# Patient Record
Sex: Female | Born: 1950 | Race: White | Hispanic: No | Smoking: Current every day smoker
Health system: Southern US, Community
[De-identification: ages and names within clinical notes are randomized; demographics above are authoritative.]

## PROBLEM LIST (undated history)

## (undated) DIAGNOSIS — R569 Unspecified convulsions: Secondary | ICD-10-CM

## (undated) DIAGNOSIS — J45909 Unspecified asthma, uncomplicated: Secondary | ICD-10-CM

## (undated) DIAGNOSIS — M170 Bilateral primary osteoarthritis of knee: Secondary | ICD-10-CM

## (undated) DIAGNOSIS — Z8489 Family history of other specified conditions: Secondary | ICD-10-CM

## (undated) DIAGNOSIS — K5140 Inflammatory polyps of colon without complications: Secondary | ICD-10-CM

## (undated) DIAGNOSIS — K08109 Complete loss of teeth, unspecified cause, unspecified class: Secondary | ICD-10-CM

## (undated) DIAGNOSIS — E70319 Ocular albinism, unspecified: Secondary | ICD-10-CM

## (undated) DIAGNOSIS — F319 Bipolar disorder, unspecified: Secondary | ICD-10-CM

## (undated) DIAGNOSIS — Z972 Presence of dental prosthetic device (complete) (partial): Secondary | ICD-10-CM

## (undated) DIAGNOSIS — D649 Anemia, unspecified: Secondary | ICD-10-CM

## (undated) DIAGNOSIS — F13231 Sedative, hypnotic or anxiolytic dependence with withdrawal delirium: Secondary | ICD-10-CM

## (undated) DIAGNOSIS — F419 Anxiety disorder, unspecified: Secondary | ICD-10-CM

## (undated) DIAGNOSIS — K219 Gastro-esophageal reflux disease without esophagitis: Secondary | ICD-10-CM

## (undated) DIAGNOSIS — R519 Headache, unspecified: Secondary | ICD-10-CM

## (undated) DIAGNOSIS — F32A Depression, unspecified: Secondary | ICD-10-CM

## (undated) DIAGNOSIS — F329 Major depressive disorder, single episode, unspecified: Secondary | ICD-10-CM

## (undated) DIAGNOSIS — J189 Pneumonia, unspecified organism: Secondary | ICD-10-CM

## (undated) DIAGNOSIS — E039 Hypothyroidism, unspecified: Secondary | ICD-10-CM

## (undated) DIAGNOSIS — Z8719 Personal history of other diseases of the digestive system: Secondary | ICD-10-CM

## (undated) DIAGNOSIS — R51 Headache: Secondary | ICD-10-CM

## (undated) DIAGNOSIS — I6523 Occlusion and stenosis of bilateral carotid arteries: Secondary | ICD-10-CM

## (undated) HISTORY — DX: Inflammatory polyps of colon without complications: K51.40

## (undated) HISTORY — DX: Unspecified asthma, uncomplicated: J45.909

## (undated) HISTORY — DX: Major depressive disorder, single episode, unspecified: F32.9

## (undated) HISTORY — PX: CATARACT EXTRACTION W/ INTRAOCULAR LENS  IMPLANT, BILATERAL: SHX1307

## (undated) HISTORY — PX: MULTIPLE TOOTH EXTRACTIONS: SHX2053

## (undated) HISTORY — PX: ROTATOR CUFF REPAIR: SHX139

## (undated) HISTORY — DX: Depression, unspecified: F32.A

## (undated) HISTORY — PX: TUBAL LIGATION: SHX77

## (undated) HISTORY — DX: Bilateral primary osteoarthritis of knee: M17.0

## (undated) HISTORY — DX: Gastro-esophageal reflux disease without esophagitis: K21.9

---

## 2002-04-30 ENCOUNTER — Emergency Department (HOSPITAL_COMMUNITY): Admission: EM | Admit: 2002-04-30 | Discharge: 2002-04-30 | Payer: Self-pay | Admitting: Emergency Medicine

## 2002-09-15 ENCOUNTER — Other Ambulatory Visit: Admission: RE | Admit: 2002-09-15 | Discharge: 2002-09-15 | Payer: Self-pay | Admitting: Obstetrics and Gynecology

## 2003-01-09 HISTORY — PX: VAGINAL HYSTERECTOMY: SUR661

## 2003-01-11 ENCOUNTER — Encounter (INDEPENDENT_AMBULATORY_CARE_PROVIDER_SITE_OTHER): Payer: Self-pay

## 2003-01-11 ENCOUNTER — Observation Stay (HOSPITAL_COMMUNITY): Admission: RE | Admit: 2003-01-11 | Discharge: 2003-01-12 | Payer: Self-pay | Admitting: Obstetrics and Gynecology

## 2003-07-26 ENCOUNTER — Ambulatory Visit (HOSPITAL_COMMUNITY): Admission: RE | Admit: 2003-07-26 | Discharge: 2003-07-26 | Payer: Self-pay | Admitting: Gastroenterology

## 2003-10-10 ENCOUNTER — Other Ambulatory Visit: Admission: RE | Admit: 2003-10-10 | Discharge: 2003-10-10 | Payer: Self-pay | Admitting: Obstetrics and Gynecology

## 2004-05-20 ENCOUNTER — Ambulatory Visit (HOSPITAL_COMMUNITY): Payer: Self-pay | Admitting: Professional Counselor

## 2004-06-09 ENCOUNTER — Ambulatory Visit (HOSPITAL_COMMUNITY): Payer: Self-pay | Admitting: Psychiatry

## 2004-06-11 ENCOUNTER — Ambulatory Visit (HOSPITAL_COMMUNITY): Payer: Self-pay | Admitting: Professional Counselor

## 2004-07-07 ENCOUNTER — Ambulatory Visit (HOSPITAL_COMMUNITY): Payer: Self-pay | Admitting: Psychiatry

## 2004-08-25 ENCOUNTER — Ambulatory Visit (HOSPITAL_COMMUNITY): Payer: Self-pay | Admitting: Psychiatry

## 2004-09-12 ENCOUNTER — Emergency Department (HOSPITAL_COMMUNITY): Admission: EM | Admit: 2004-09-12 | Discharge: 2004-09-12 | Payer: Self-pay | Admitting: Emergency Medicine

## 2004-10-17 ENCOUNTER — Ambulatory Visit: Payer: Self-pay | Admitting: Internal Medicine

## 2004-10-20 ENCOUNTER — Ambulatory Visit (HOSPITAL_COMMUNITY): Admission: RE | Admit: 2004-10-20 | Discharge: 2004-10-20 | Payer: Self-pay | Admitting: Internal Medicine

## 2004-11-04 ENCOUNTER — Ambulatory Visit: Payer: Self-pay | Admitting: Internal Medicine

## 2004-11-12 ENCOUNTER — Ambulatory Visit (HOSPITAL_COMMUNITY): Payer: Self-pay | Admitting: Psychiatry

## 2004-12-10 ENCOUNTER — Ambulatory Visit (HOSPITAL_COMMUNITY): Payer: Self-pay | Admitting: Physician Assistant

## 2005-01-07 ENCOUNTER — Ambulatory Visit: Payer: Self-pay | Admitting: Psychiatry

## 2005-01-07 ENCOUNTER — Ambulatory Visit (HOSPITAL_COMMUNITY): Payer: Self-pay | Admitting: Psychiatry

## 2005-02-17 ENCOUNTER — Ambulatory Visit: Payer: Self-pay | Admitting: *Deleted

## 2005-03-02 ENCOUNTER — Ambulatory Visit (HOSPITAL_COMMUNITY): Payer: Self-pay | Admitting: Psychiatry

## 2005-04-02 ENCOUNTER — Ambulatory Visit: Payer: Self-pay | Admitting: Internal Medicine

## 2005-04-24 ENCOUNTER — Ambulatory Visit: Payer: Self-pay | Admitting: Internal Medicine

## 2005-05-04 ENCOUNTER — Ambulatory Visit (HOSPITAL_COMMUNITY): Payer: Self-pay | Admitting: Psychiatry

## 2005-07-06 ENCOUNTER — Ambulatory Visit (HOSPITAL_COMMUNITY): Payer: Self-pay | Admitting: Psychiatry

## 2005-09-14 ENCOUNTER — Ambulatory Visit (HOSPITAL_COMMUNITY): Payer: Self-pay | Admitting: Psychiatry

## 2005-10-12 ENCOUNTER — Ambulatory Visit (HOSPITAL_COMMUNITY): Payer: Self-pay | Admitting: Physician Assistant

## 2005-11-26 ENCOUNTER — Ambulatory Visit (HOSPITAL_COMMUNITY): Payer: Self-pay | Admitting: Psychiatry

## 2005-12-22 ENCOUNTER — Ambulatory Visit (HOSPITAL_COMMUNITY): Payer: Self-pay | Admitting: Psychiatry

## 2006-06-01 ENCOUNTER — Ambulatory Visit: Payer: Self-pay | Admitting: Internal Medicine

## 2006-06-08 ENCOUNTER — Ambulatory Visit: Payer: Self-pay | Admitting: Internal Medicine

## 2006-06-08 LAB — CONVERTED CEMR LAB
AST: 26 units/L (ref 0–37)
Albumin: 3.7 g/dL (ref 3.5–5.2)
Alkaline Phosphatase: 129 units/L — ABNORMAL HIGH (ref 39–117)
BUN: 11 mg/dL (ref 6–23)
Creatinine, Ser: 0.9 mg/dL (ref 0.4–1.2)
GFR calc non Af Amer: 69 mL/min
Glomerular Filtration Rate, Af Am: 84 mL/min/{1.73_m2}
Sodium: 137 meq/L (ref 135–145)
Total Bilirubin: 0.6 mg/dL (ref 0.3–1.2)
Total Protein: 6.9 g/dL (ref 6.0–8.3)

## 2006-06-21 ENCOUNTER — Ambulatory Visit: Payer: Self-pay

## 2006-07-06 ENCOUNTER — Ambulatory Visit: Payer: Self-pay

## 2006-07-28 ENCOUNTER — Ambulatory Visit: Payer: Self-pay | Admitting: Internal Medicine

## 2006-08-05 ENCOUNTER — Ambulatory Visit (HOSPITAL_COMMUNITY): Payer: Self-pay | Admitting: Psychiatry

## 2006-08-23 ENCOUNTER — Ambulatory Visit: Payer: Self-pay | Admitting: Internal Medicine

## 2006-08-31 ENCOUNTER — Ambulatory Visit (HOSPITAL_COMMUNITY): Payer: Self-pay | Admitting: Psychiatry

## 2006-10-28 ENCOUNTER — Ambulatory Visit (HOSPITAL_COMMUNITY): Payer: Self-pay | Admitting: Psychiatry

## 2007-01-27 ENCOUNTER — Ambulatory Visit (HOSPITAL_COMMUNITY): Payer: Self-pay | Admitting: Psychiatry

## 2007-02-18 ENCOUNTER — Ambulatory Visit (HOSPITAL_COMMUNITY): Payer: Self-pay | Admitting: Psychiatry

## 2007-03-16 ENCOUNTER — Ambulatory Visit (HOSPITAL_COMMUNITY): Payer: Self-pay | Admitting: Psychiatry

## 2007-05-17 ENCOUNTER — Ambulatory Visit (HOSPITAL_COMMUNITY): Payer: Self-pay | Admitting: Psychiatry

## 2007-09-15 ENCOUNTER — Ambulatory Visit (HOSPITAL_COMMUNITY): Payer: Self-pay | Admitting: Psychiatry

## 2007-12-08 ENCOUNTER — Ambulatory Visit (HOSPITAL_COMMUNITY): Payer: Self-pay | Admitting: Psychiatry

## 2008-01-01 ENCOUNTER — Emergency Department (HOSPITAL_COMMUNITY): Admission: EM | Admit: 2008-01-01 | Discharge: 2008-01-01 | Payer: Self-pay | Admitting: Emergency Medicine

## 2008-01-30 ENCOUNTER — Ambulatory Visit (HOSPITAL_COMMUNITY): Payer: Self-pay | Admitting: Psychiatry

## 2008-02-27 ENCOUNTER — Ambulatory Visit (HOSPITAL_COMMUNITY): Payer: Self-pay | Admitting: Psychiatry

## 2008-08-08 ENCOUNTER — Ambulatory Visit (HOSPITAL_COMMUNITY): Payer: Self-pay | Admitting: Psychiatry

## 2008-12-03 ENCOUNTER — Emergency Department (HOSPITAL_COMMUNITY): Admission: EM | Admit: 2008-12-03 | Discharge: 2008-12-03 | Payer: Self-pay | Admitting: Emergency Medicine

## 2008-12-12 ENCOUNTER — Ambulatory Visit (HOSPITAL_COMMUNITY): Payer: Self-pay | Admitting: Psychiatry

## 2010-12-26 NOTE — Op Note (Signed)
NAME:  [name]                        ACCOUNT NO.:  [id number]   MEDICAL RECORD NO.:  [id number]                   PATIENT TYPE:  AMB   LOCATION:  ENDO                                 FACILITY:  [facility]   PHYSICIAN:  James L. Malon Kindle., M.D.          DATE OF BIRTH:  06-Apr-1951   DATE OF PROCEDURE:  07/26/2003  DATE OF DISCHARGE:                                 OPERATIVE REPORT   PROCEDURE:  Esophagogastroduodenoscopy.   MEDICATIONS GIVEN:  Cetacaine spray, Fentanyl 50 mcg, Versed 7.5 mg IV.   INDICATIONS FOR PROCEDURE:  Esophageal reflux, brother with adenocarcinoma  of the esophagus.  The patient has had increasing symptoms despite the use  of Prevacid.   DESCRIPTION OF PROCEDURE:  The procedure had been explained to the patient  and consent obtained.  With the patient in the left lateral decubitus  position, the Olympus scope was inserted and advanced.  The stomach was  entered, the pylorus identified and passed.  The duodenum, duodenal bulb,  and second portion were seen.  The scope was withdrawn back into the  stomach.  The pyloric channel was normal.  The antrum and body were normal.  The fundus and cardia were seen on the retroflex view and were normal.  The  distal and proximal esophagus were seen well and were endoscopically normal.  The GE junction was widely patent with signs of reflux such as reddened  esophagus.  The scope was withdrawn.  The patient tolerated the procedure  well.   ASSESSMENT:  Gastroesophageal reflux, 530.81, no signs of Barrett's or  esophageal cancer.   PLAN:  Will continue Prevacid, give antireflux instructions, proceed  with  colonoscopy at this time.                                               James L. Malon Kindle., M.D.    Waldron Session  D:  07/26/2003  T:  07/26/2003  Job:  [id number]   cc:   Tresa Endo NP Ian Bushman

## 2010-12-26 NOTE — Discharge Summary (Signed)
   NAME:  Devra Dopp                        ACCOUNT NO.:  [id number]   MEDICAL RECORD NO.:  [id number]                   PATIENT TYPE:  OBV   LOCATION:  9303                                 [facility]   PHYSICIAN:  Juluis Mire, M.D.                DATE OF BIRTH:  June 26, 1951   DATE OF ADMISSION:  01/11/2003  DATE OF DISCHARGE:  01/12/2003                                 DISCHARGE SUMMARY   ADMITTING DIAGNOSES:  1. Severe cervical dysplasia.  2. Abnormal uterine bleeding.   DISCHARGE DIAGNOSES:  1. Severe cervical dysplasia.  2. Abnormal uterine bleeding.  3. Pathology pending.   OPERATIVE PROCEDURE:  Laparoscopy-assisted vaginal hysterectomy with  bilateral salpingo-oophorectomy.   For complete History and Physical see dictated note.   COURSE IN THE HOSPITAL:  The patient underwent the above-noted surgery  without complications.  On postoperative day #1 was afebrile with stable  vital signs.  Abdomen was soft and nontender.  All incisions were intact.  She has no active vaginal bleeding.  Her hemoglobin at that time was 11.2.  She was discharged home that day.   COMPLICATIONS:  None encountered during her stay in the hospital.   CONDITION:  The patient discharged home in stable condition.   DISPOSITION:  1. Routine postoperative instructions were given.  2. She was to avoid heavy lifting, vaginal entrance, or driving of a car.  3. Discharged home on Demerol if she needed it for pain.  4. She will follow up in the office in one week.  5. She is to call with fever, nausea/vomiting, increasing abdominal pain, or     active vaginal bleeding.                                               Juluis Mire, M.D.    JSM/MEDQ  D:  01/12/2003  T:  01/12/2003  Job:  [id number]

## 2010-12-26 NOTE — Op Note (Signed)
NAME:  [name]                        ACCOUNT NO.:  [id number]   MEDICAL RECORD NO.:  [id number]                   PATIENT TYPE:  OBV   LOCATION:  9303                                 [facility]   PHYSICIAN:  Juluis Mire, M.D.                DATE OF BIRTH:  05/21/1951   DATE OF PROCEDURE:  01/11/2003  DATE OF DISCHARGE:                                 OPERATIVE REPORT   PREOPERATIVE DIAGNOSES:  1. Abnormal uterine bleeding.  2. Severe cervical dysplasia.   POSTOPERATIVE DIAGNOSES:  1. Abnormal uterine bleeding.  2. Severe cervical dysplasia.  3. Pathology pending.   PROCEDURE:  Laparoscopically-assisted vaginal hysterectomy with bilateral  salpingo-oophorectomy.   SURGEON:  Juluis Mire, M.D.   ASSISTANT:  Stann Mainland. Vincente Poli, M.D.   ANESTHESIA:  General endotracheal.   ESTIMATED BLOOD LOSS:  300 mL.   PACKS AND DRAINS:  None.   INTRAOPERATIVE BLOOD REPLACED:  None.   COMPLICATIONS:  None.   INDICATIONS:  Dictated in the history and physical.   PROCEDURE:  The patient was taken to the OR and placed in supine position.  After a satisfactory level of general endotracheal anesthesia obtained, the  patient was placed in the dorsal lithotomy position using the Allen  stirrups.  The abdomen, perineum, and vagina were prepped out with Betadine.  The bladder was emptied by in-and-out catheterization.  A Hulka tenaculum  was put in place and secured.  The patient was draped out as a sterile  field.  A subumbilical incision made with a knife.  The incision was  extended through the subcutaneous tissue.  The fascia was identified,  entered sharply, and the incision in the fascia extended laterally.  Rectus  muscles were separated in the midline.  The perineum was then entered.  Two  lateral sutures of 0 Vicryl were placed in the fascial edge and secured.  The open laparoscopic trocar was put in place and secured.  The abdomen was  inflated with carbon  dioxide.  The laparoscope was introduced.  There was no  evidence of injury to adjacent organs.  A 5 mm trocar was put in place under  direct visualization in the suprapubic area.  The uterus was posterior,  upper limits of normal size.  Tubes and ovaries were unremarkable.  There  was no evidence of any pelvic pathology, specifically no endometriosis or  adhesions.  The appendix was retrocecal and unremarkable.  The upper  abdomen, including the liver and tip of the gallbladder, was clear.  Using  the Gyrus bipolar cutting unit, we decided to take down the adnexa.  We  first went to the left adnexa.  We elevated the ovary.  The ureter was  easily visualized.  We first cauterized and incised the left ovarian  vasculature.  Then we cauterized and incised the peritoneal attachment of  the ovary and tube up to the round ligament.  Then the round ligament was  cauterized and incised.  We then went to the right side, identified the  ureter.  We elevated the ovary on the right side.  We cauterized and incised  the ovarian vasculature.  We cauterized and incised the peritoneal  attachments of the ovary and tube up to the round ligament.  Then the round  ligament was cauterized and incised.  We had good hemostasis bilaterally and  freeing up of the adnexa.  At this point in time the abdomen was deflated of  its carbon dioxide, the laparoscope was removed.   The patient's legs were repositioned, the Hulka tenaculum then removed.  A  weighted speculum was then placed in the vaginal vault.  The cervix was  grasped with a Christella Hartigan tenaculum.  The cul-de-sac was entered sharply.  Both  uterosacral ligaments were clamped, cut, and suture ligated with 0 Vicryl.  The reflection of the vaginal mucosa anteriorly was incised and the bladder  was dissected superiorly.  Paracervical tissue was clamped, cut, and suture  ligated with 0 Vicryl.  The vesicouterine space was entered and a retractor  was put in place  to retract the bladder superiorly.  Using the clamp, cut,  and tie technique with suture ligature of 0 Vicryl, parametrium was serially  separated from the sides of the uterus.  The uterus was then flipped.  The  remaining pedicles were clamped and cut and the uterus passed off the  operative field.  The pedicles were secured with free ties of 0 Vicryl.  There was a small amount of bleeding encountered on the right side of the  vaginal cuff from an arterial bleeder.  It was identified and clamped and  suture ligated with 0 Vicryl.  With this we had good hemostasis.  A  uterosacral plication stitch of 0 Vicryl was put in place and secured.  The  vaginal mucosa was then reapproximated in a vertical fashion with  interrupted figure-of-eights of 0 Vicryl.  A Foley was placed to straight  drain with retrieval of an adequate amount of clear urine.  A sponge on a  sponge stick was placed in the vaginal vault.  Legs were repositioned.   The abdomen was reinflated with carbon dioxide, the laparoscope was  reintroduced.  We visualized the vaginal cuff and both ovarian vasculature.  There was good hemostasis with no active bleeding at this point in time.  We  thoroughly irrigated the pelvis.  Hemostasis was excellent.  The abdomen  deflated of carbon dioxide, all trocars removed.  The subumbilical fascia  closed with interrupted figure-of-eights of 0 Vicryl.  Skin was closed with  interrupted subcuticulars of 4-0 Vicryl.  The suprapubic incision was closed  with Steri-Strips.  The sponge on a sponge stick was removed from the  vaginal vault.  The patient taken out of the dorsal lithotomy position, once  alert and extubated transferred to the recovery room in good condition.  Sponge, instrument, and needle count reported as correct by the circulating  nurse.  The patient did tolerate the procedure well and was returned to the recovery room in good condition.                                                Juluis Mire, M.D.    JSM/MEDQ  D:  01/11/2003  T:  01/12/2003  Job:  [id number]

## 2010-12-26 NOTE — Op Note (Signed)
NAME:  [name]                        ACCOUNT NO.:  [id number]   MEDICAL RECORD NO.:  [id number]                   PATIENT TYPE:  AMB   LOCATION:  ENDO                                 FACILITY:  [facility]   PHYSICIAN:  Brittany L. Malon Kindle., M.D.          DATE OF BIRTH:  03/18/1951   DATE OF PROCEDURE:  07/26/2003  DATE OF DISCHARGE:                                 OPERATIVE REPORT   PROCEDURE PERFORMED:  Colonoscopy.   ENDOSCOPIST:  Llana Aliment. Randa Evens, M.D.   MEDICATIONS:  The patient received a total of 120 mcg fentanyl and 12 mg  Versed IV.   INSTRUMENT USED:  Pediatric Olympus adjustable colonoscope.   INDICATIONS FOR PROCEDURE:  Strong family history of colon cancer.  Brother  had colon cancer.   DESCRIPTION OF PROCEDURE:  The procedure had been explained to the patient  and consent obtained.  With the patient in the left lateral decubitus  position, the Olympus colonoscope was inserted and advanced.  We were able  to advance to the cecum.  The ileocecal valve and appendiceal orifice were  seen.  The scope was withdrawn and the cecum, ascending colon, transverse  colon, descending and sigmoid colon were seen well upon removal.  No polyps  or other lesions were seen.  The scope was withdrawn.  The patient tolerated  the procedure well.   ASSESSMENT:  Strong family history of colon cancer with negative  colonoscopy, code V16.0.   PLAN:  Will recommend yearly Hemoccults and repeat colonoscopy in five  years.                                               Brittany L. Malon Kindle., M.D.    [name]  D:  07/26/2003  T:  07/26/2003  Job:  [id number]   cc:   Tinnie Gens Med

## 2010-12-26 NOTE — H&P (Signed)
NAME:  [name]                        ACCOUNT NO.:  [id number]   MEDICAL RECORD NO.:  [id number]                   PATIENT TYPE:  OBV   LOCATION:  9399                                 [facility]   PHYSICIAN:  Juluis Mire, M.D.                DATE OF BIRTH:  1950/10/29   DATE OF ADMISSION:  01/11/2003  DATE OF DISCHARGE:                                HISTORY & PHYSICAL   HISTORY:  The patient is a 60 year old gravida 2, para 2 married white  female, who presents for laparoscopically assisted vaginal hysterectomy with  bilateral salpingo-oophorectomy.   In relation to the present admission, the patient has had elevated FSH  levels consistent with menopausal status.  She has had continued abnormal  post-menopausal bleeding.  She underwent a saline infused ultrasound and  endometrial sampling.  There was no evidence of polyps or thickness.  Endometrial sampling was basically negative.  It did reveal some simple  hyperplasia but no atypia.  She has had continued bleeding issues.  Bleeding  has been off and on and at time excessive.  This was becoming increasingly a  problem for the patient.  Associated with this was some discomfort.  She is  sexually active with no discomfort, however.  In addition to this, the  patient has had a previous LEEP of the cervix, with the finding of severe  cervical dysplasia with endocervical involvement.  In view of this and  continued abnormal bleeding, the patient decided to proceed with definitive  therapy in the form of laparoscopically assisted vaginal hysterectomy with  bilateral salpingo-oophorectomy.   ALLERGIES:  CODEINE.   MEDICATIONS:  Synthroid, Klonopin, Paxil.   PAST MEDICAL HISTORY:  The usual childhood diseases.  Does have a history of  hypothyroidism, under active management.   SOCIAL HISTORY:  She has had a previous bilateral tubal ligation.   OBSTETRICAL HISTORY:  Two vaginal deliveries.   FAMILY HISTORY:  Mother  with a history of pancreatic cancer.  Father with  history of heart disease.   SOCIAL HISTORY:  One pack/day tobacco use.  No alcohol use.   REVIEW OF SYSTEMS:  Noncontributory.   PHYSICAL EXAMINATION:  VITAL SIGNS:  The patient is afebrile with stable  vital signs.  HEENT:  The patient is normocephalic.  Pupils are round, reactive to light  and accommodation.  Extraocular movements were intact.  Sclerae and  conjunctivae are clear.  Oropharynx clear.  NECK:  Without thyromegaly.  BREASTS:  Not examined.  LUNGS:  Clear.  CARDIAC:  Regular rate and rhythm; without murmurs or gallops.  ABDOMEN:  Benign.  No mass, organomegaly or tenderness.  PELVIC:  Normal external genitalia.  Vaginal mucosa clear.  Cervix is  unremarkable.  Uterus normal size, shape and contour.  Adnexa are free of  masses or tenderness.  Rectovaginal examination is clear.  EXTREMITIES:  Trace edema.  NEUROLOGIC:  Grossly normal.  IMPRESSION:  1. Severe cervical dysplasia, with endocervical involvement.  2. Post-menopausal bleeding, unresponsive to conservative management.   PLAN:  The patient will undergo laparoscopically assisted vaginal  hysterectomy with bilateral salpingo-oophorectomy .  The risks of surgery  have been discussed, including the risks of infection;  risks of hemorrhage  that could necessitate transfusion with the risk of AIDS or Hepatitis; the  risks of injury to adjacent organs, including bladder, bowel or ureters that  could require further exploratory surgery; the risks of deep venous  thrombosis and pulmonary embolus.  She also understands that there can be  recurrent dysplastic changes of the vaginal cuff, requiring further  evaluation and management.                                               Juluis Mire, M.D.    JSM/MEDQ  D:  01/11/2003  T:  01/11/2003  Job:  [id number]

## 2010-12-26 NOTE — Assessment & Plan Note (Signed)
[facility]                          [place] OFFICE NOTE   NAME:KNIGHTSatcha, Storlie                     MRN:          [id number]  DATE:06/01/2006                            DOB:          05/07/1951    CHIEF COMPLAINT:  Spells.   HISTORY OF PRESENT ILLNESS:  Mrs. Abboud is a 60 year old white female with  a history of bipolar, who came to the office with a 2 month history of  episodes characterized by weakness, clamminess, shakiness.  The patient also  gets dizzy.  She feels like she is about to pass out.  She actually has  never lost consciousness.  These episodes last 10 minutes, and she has 1 to  3 spells a week.  The symptoms are not exertional and are rather associated  with emotional distress.  [facility].  EKG was  done, she also had some blood work, and she was told to see a primary care  doctor as soon as possible.   PAST MEDICAL HISTORY:  1. Bipolar diagnosed in 2005.  2. Hypothyroidism.  3. Hysterectomy and BSO in 2004.  4. Shoulder surgery.  5. Dr. Randa Evens performed an EGD and a colonoscopy in 2004.   FAMILY HISTORY:  1. Father is deceased from a massive MI at age 55, and she also has      another distant relative with coronary artery disease.  2. Some family members are affected with diabetes.  3. Brother had colon cancer.  4. No history of breast cancer.   SOCIAL HISTORY:  The patient smokes a pack a day.  Does not drink.  She is  married and has 2 children.  She is presently without a job.   REVIEW OF SYSTEMS:  She denies any fever, lower extremity edema, cough.  She  admits to 1 episode of chest pain related with the above-described spells.  This was described as mild, mostly at the left lower chest, without nausea,  but with some diaphoresis.  No headache. She admits to being under a lot of  stress initially because she was training for a job, but right now she is  jobless, and that  causes her some stress as well.   MEDICATIONS:  1. Synthroid 112 mcg a day.  2. Prozac 40 one p.o. q. day.  3. Klonopin 1 mg 1 p.o. b.i.d.  4. Prilosec.  5. Seroquel 100 mg 1 p.o. nightly.   ALLERGIES:  CODEINE.   PHYSICAL EXAM:  The patient is alert, oriented.  She is slightly anxious.  Weight 162, pulse 100, respirations 16, blood pressure 120/80.  NECK:  No JVD.  LUNGS:  Basically clear without any respiratory distress.  CARDIOVASCULAR:  Regular rate and rhythm without a murmur.  EXTREMITIES:  No edema.  ABDOMEN:  Not distended.  Soft.  Good bowel sounds.  No organomegaly.  NEUROLOGIC:  Speech, gait, motor are intact.   LABORATORY AND X-RAYS:  EKG at [facility] shows sinus rhythm.  EKG repeated  today is sinus rhythm without any acute changes.   ASSESSMENT AND PLAN:  1. The patient has episodes, as described above.  The most-likely      diagnosis is anxiety.  However, given her multiple risk factors,      including age and tobacco abuse, we will have to rule out underlying      cardiovascular disease.  She also has a family history of heart disease      to some degree since her father had a heart attack, even though it was      late in his life.  At this point, I am going to wait and see what blood      work was done at [facility], and depending on that, I will request, at      the very minimum, a CBC, and a comprehensive panel.  After that, we      would proceed with a stress test.  She is advised to go to the      emergency room if she gets worse.  In the case of an acute attack, she      also can try Atarax 25 mg 1 p.o. q.i.d.  I prescribed her 30 without      refills.  She is aware of the drowsiness potential.  I tried to      prescribe Xanax with her.  However, she reports that at some point in      her life, she did abuse it, so I will stay away from it.  2. Bipolar disorder.  I recommend the patient to see her psychiatrist for      a dose adjustment.  3. History of  hyperthyroidism.  Will need to check a TSH if that was not      done at [facility].       Willow Ora, MD      JP/MedQ  DD:  06/01/2006  DT:  06/02/2006  Job #:  [id number]

## 2011-09-10 ENCOUNTER — Other Ambulatory Visit: Payer: Self-pay | Admitting: Gastroenterology

## 2011-09-10 DIAGNOSIS — R1013 Epigastric pain: Secondary | ICD-10-CM

## 2011-09-15 ENCOUNTER — Other Ambulatory Visit: Payer: Self-pay

## 2011-09-23 ENCOUNTER — Ambulatory Visit: Payer: Self-pay | Admitting: Internal Medicine

## 2011-09-23 ENCOUNTER — Ambulatory Visit
Admission: RE | Admit: 2011-09-23 | Discharge: 2011-09-23 | Disposition: A | Discharge status: Home / Self Care | Payer: 59 | Source: Ambulatory Visit | Attending: Gastroenterology | Admitting: Gastroenterology

## 2011-09-23 DIAGNOSIS — R1013 Epigastric pain: Secondary | ICD-10-CM

## 2011-09-23 MED ORDER — IOHEXOL 300 MG/ML  SOLN
100.0000 mL | Freq: Once | INTRAMUSCULAR | Status: AC | PRN
  Administered 2011-09-23: 100 mL via INTRAVENOUS

## 2011-10-21 ENCOUNTER — Ambulatory Visit: Payer: Self-pay | Admitting: Internal Medicine

## 2011-10-30 ENCOUNTER — Other Ambulatory Visit: Payer: Self-pay | Admitting: Gastroenterology

## 2012-01-01 ENCOUNTER — Other Ambulatory Visit: Payer: Self-pay | Admitting: Gastroenterology

## 2012-01-13 ENCOUNTER — Encounter: Payer: Self-pay | Admitting: Family Medicine

## 2012-01-13 ENCOUNTER — Ambulatory Visit (INDEPENDENT_AMBULATORY_CARE_PROVIDER_SITE_OTHER): Payer: 59 | Admitting: Family Medicine

## 2012-01-13 ENCOUNTER — Ambulatory Visit: Payer: 59 | Admitting: Family Medicine

## 2012-01-13 VITALS — BP 112/78 | HR 106 | Temp 98.3°F | Ht 65.0 in | Wt 156.8 lb

## 2012-01-13 DIAGNOSIS — E039 Hypothyroidism, unspecified: Secondary | ICD-10-CM

## 2012-01-13 DIAGNOSIS — M170 Bilateral primary osteoarthritis of knee: Secondary | ICD-10-CM

## 2012-01-13 DIAGNOSIS — F319 Bipolar disorder, unspecified: Secondary | ICD-10-CM | POA: Insufficient documentation

## 2012-01-13 DIAGNOSIS — M1710 Unilateral primary osteoarthritis, unspecified knee: Secondary | ICD-10-CM

## 2012-01-13 DIAGNOSIS — F17200 Nicotine dependence, unspecified, uncomplicated: Secondary | ICD-10-CM

## 2012-01-13 DIAGNOSIS — Z720 Tobacco use: Secondary | ICD-10-CM | POA: Insufficient documentation

## 2012-01-13 DIAGNOSIS — F3132 Bipolar disorder, current episode depressed, moderate: Secondary | ICD-10-CM

## 2012-01-13 MED ORDER — ACETAMINOPHEN-CODEINE 300-60 MG PO TABS: 1.0000 | ORAL_TABLET | ORAL | Status: DC | PRN

## 2012-01-13 NOTE — Progress Notes (Signed)
  Subjective:    Patient ID: [name], female    DOB: 1951-05-07, 61 y.o.   MRN: [id number]  HPI New to establish.  Previous MD- Jeannetta Nap and prior to that, [facility].  Pt reports she was having confrontations w/ PA at previous office over filling meds too early.  Has not had recent CPE, no recent mammo (2003)  Osteoarthritis of knees- bilateral.  Pt taking 6 T4 tabs daily.  Has not seen ortho for this, not in pain management.  Saw Dr Madelon Lips for shoulder surgery.  On disability for knee pain/bipolar.  Bipolar- chronic problem, on disability for this.  Currently on Effexor and Seroquel.  Has never been on mood stabilizer.  Not seeing psych.  Previous PCP was managing.  Dora Sims at [facility] was managing before she switched to Dr Jeannetta Nap.  Does not feel sxs are well controlled on current meds.  Has had 4 day episode of 'just crying and i don't know why'.  Will also have episodes of hypomania.  Hypothyroid- dx'd in 1986.  Previously seeing Dr Dagoberto Ligas.  Has had recent lab work.  On Synthroid .  Tobacco use- chronic problem, smoking 1 ppd.  'thinking about it' in regards to quitting.  Family hx of pancreas/colon/esophageal cancer- pt has appt upcoming at [facility] due to flat polyp seen on colonoscopy done by Dr Evette Cristal.     Review of Systems For ROS see HPI     Objective:   Physical Exam  Vitals reviewed. Constitutional: She is oriented to person, place, and time. She appears well-developed and well-nourished. No distress.  HENT:  Head: Normocephalic and atraumatic.  Eyes: Conjunctivae and EOM are normal. Pupils are equal, round, and reactive to light.  Neck: Normal range of motion. Neck supple. No thyromegaly present.  Cardiovascular: Normal rate, regular rhythm, normal heart sounds and intact distal pulses.   No murmur heard. Pulmonary/Chest: Effort normal and breath sounds normal. No respiratory distress.  Abdominal: Soft. She exhibits no distension. There is no tenderness.    Musculoskeletal: She exhibits no edema.       Degenerative changes of bilateral knees along joint lines  Lymphadenopathy:    She has no cervical adenopathy.  Neurological: She is alert and oriented to person, place, and time.  Skin: Skin is warm and dry.  Psychiatric: She has a normal mood and affect. Her behavior is normal.          Assessment & Plan:

## 2012-01-13 NOTE — Patient Instructions (Signed)
Schedule your complete physical at your convenience We'll call you with your ortho appt- if you do not go to this appt, I will not be able to continue to prescribe your pain medication (I don't treat chronic pain) Please call and schedule an appt with a psychiatrist to better manage your bipolar Call with any questions or concerns Hang in there!!

## 2012-01-18 ENCOUNTER — Other Ambulatory Visit: Payer: Self-pay | Admitting: Family Medicine

## 2012-01-18 NOTE — Telephone Encounter (Signed)
Patient called & LM on triage line for a refill for  KLONOPIN 0.5 MG Take 0.5 mg by mouth 3 (three) times daily as needed. Please call if you can refill [phone] Patient did not specify pharmacy last pharmacy used on a prescription is   [facility] - [place] - [address]

## 2012-01-18 NOTE — Telephone Encounter (Signed)
Ok for #90, no refills but will then need to obtain from psych

## 2012-01-18 NOTE — Telephone Encounter (Signed)
Last OV noted 01-13-12, pt was NEW to Establish, please advise

## 2012-01-19 ENCOUNTER — Telehealth: Payer: Self-pay | Admitting: *Deleted

## 2012-01-19 ENCOUNTER — Telehealth: Payer: Self-pay | Admitting: Family Medicine

## 2012-01-19 MED ORDER — CLONAZEPAM 0.5 MG PO TABS: 0.5000 mg | ORAL_TABLET | Freq: Three times a day (TID) | ORAL | Status: DC | PRN

## 2012-01-19 NOTE — Telephone Encounter (Signed)
noted 

## 2012-01-19 NOTE — Telephone Encounter (Signed)
Pt left msg on triage vmail returning your call.

## 2012-01-19 NOTE — Telephone Encounter (Signed)
Caller: [name]/Patient; PCP: Sheliah Hatch.; CB#: [phone]; ; ; Call regarding Medication Question; Calling from [facility] [phone].  RPh got on phone and asked about Rx as their power has been out for hours so they cannot get faxed orders.  Asked to verify if the Rx for Clonazepam has been authorized.  Advised of order in EMR 01/19/12.  Voiced understanding and has no further questions.  Call ended and unable to reestablish contact with patient or pharmacy.  Medication Questions - Adult protocol used.

## 2012-01-19 NOTE — Telephone Encounter (Signed)
Noted a vm received from [place] stating she is with [facility] advising the pt has received her RX successfully, will call pharmacy tomorrow to clarify once the power is back on per unable to verify caller Francine Graven truly a pharmacist. MD Beverely Low made aware verbally

## 2012-01-19 NOTE — Telephone Encounter (Signed)
Noted incoming call to CMA Kristie Cowman advising that the pharmacy [facility] is out of power since yesterday and the RX needs to be called into the pharmacy personal cell number [phone], tried to contact [facility] number listed in chart as [phone]  And noted no answer, called [phone] and was routed to VM, left message to contact our office about this medication refill left personal extension to notify, received vm from [place] from number [phone] advising that the pt medication was refilled successfully, will contact pharmacy tomorrow to clarify.
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
## 2012-01-19 NOTE — Telephone Encounter (Signed)
Pt returned call and was advised that a refill will be faxed today however she will need to follow up with psych to get further refills, pt understood and stated she will call the one MD Beverely Low suggested to make her apt

## 2012-01-20 NOTE — Telephone Encounter (Signed)
Done

## 2012-01-21 ENCOUNTER — Other Ambulatory Visit: Payer: Self-pay | Admitting: Family Medicine

## 2012-01-21 MED ORDER — QUETIAPINE FUMARATE 400 MG PO TABS: 400.0000 mg | ORAL_TABLET | Freq: Every day | ORAL | Status: DC

## 2012-01-21 NOTE — Telephone Encounter (Signed)
All medications have been clarified with pharmacy tech nancy at [facility]. Pt aware

## 2012-01-21 NOTE — Telephone Encounter (Signed)
Discuss with patient, Rx sent. 
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
## 2012-01-21 NOTE — Telephone Encounter (Signed)
Pt calling for refill on Seroquel; she prefers [facility].  Please call her to advise.

## 2012-01-21 NOTE — Telephone Encounter (Signed)
Ok for #30 but I do not prescribe this med- needs to see psych as recommended at OV

## 2012-01-31 NOTE — Assessment & Plan Note (Signed)
New to provider.  Ongoing for pt.  Pt is considering quitting.  Encouraged this.  Will follow at future visits.

## 2012-01-31 NOTE — Assessment & Plan Note (Signed)
New to provider, ongoing for pt.  She has never seen ortho for this issue but reports this is why she is, in part, collecting disability.  I am not comfortable writing such large amounts of narcotics for someone who has not seen ortho for discussion on possible tx options.  If there are no other options, pt will need to see pain management.

## 2012-01-31 NOTE — Assessment & Plan Note (Signed)
New to provider, chronic for pt.  She doesn't feel sxs are well controlled on current meds.  Had very frank discussion w/ pt that I do not treat bipolar- particularly if sxs are not stable- and she needs to re-establish w/ psych.  List of providers w/ contact info given.  Pt to call and set up appt.

## 2012-01-31 NOTE — Assessment & Plan Note (Signed)
New to provider.  Chronic for pt.  She reports she recently had labs done.  Will get records and adjust meds prn.

## 2012-02-02 ENCOUNTER — Other Ambulatory Visit: Payer: Self-pay | Admitting: *Deleted

## 2012-02-02 NOTE — Telephone Encounter (Signed)
Spoke with pt & she is concerned that Dr. Beverely Low will not refill her Klonopin & Seroquil before her next appt which is 8.28.13. Pt would like a call back to advise her on what to do.

## 2012-02-02 NOTE — Telephone Encounter (Signed)
Spoke to MD Beverely Low with pt concern per pt has upcoming CPE on 04-06-12 and with MD Tabori and upcoming apt with Ortho on 03-31-12 [place], MD Tabori gave verbal instructions that she will fill these medications for the pt to last until her apt with Ortho however pt needs to keep her ortho apt per MD Beverely Low does not manage chronic pain, pt was advised all instructions and understood, pt will keep all upcoming apts and call office when her refills are due

## 2012-02-04 ENCOUNTER — Telehealth: Payer: Self-pay | Admitting: Family Medicine

## 2012-02-04 NOTE — Telephone Encounter (Signed)
Patient called in at 2pm stated she just spoke to someone here regarding her ortho appt/medication refills. It does appear she was spoken to on 6.25.13 but not today. Patient requested status of the following  Clonazepam & seroquel Advised patient Clonazepam called in 6.11 to elizabeth, seroquel to nancy 6.13.13 Patient still seemed confused, you may want to call her back but she did not request it.

## 2012-02-05 NOTE — Telephone Encounter (Signed)
.  left message to have patient return my call.  
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
## 2012-02-09 ENCOUNTER — Other Ambulatory Visit: Payer: Self-pay | Admitting: *Deleted

## 2012-02-09 NOTE — Telephone Encounter (Signed)
01-13-12 Last OV, Last filled #204

## 2012-02-09 NOTE — Telephone Encounter (Signed)
Has pt seen ortho about her knee pain and the need for this much pain medication?  I will not continue to prescribe this amount of narcotics.  Pt will need to tx pain w/ ortho or pain management.

## 2012-02-10 MED ORDER — ACETAMINOPHEN-CODEINE 300-60 MG PO TABS: 1.0000 | ORAL_TABLET | ORAL | Status: DC | PRN

## 2012-02-10 NOTE — Telephone Encounter (Signed)
Ok for #204, no refills

## 2012-02-10 NOTE — Telephone Encounter (Signed)
.  rx faxed to pharmacy, manually called pt to advise, pt understood faxed to [facility]

## 2012-02-10 NOTE — Telephone Encounter (Signed)
.  left message to have patient return my call, noted upcoming apt for 03-31-12 with ortho

## 2012-02-10 NOTE — Telephone Encounter (Signed)
Pt called back to advise that she will see ortho on 03-31-12 but Dr Beverely Low advise she would filled med until appt.

## 2012-02-15 ENCOUNTER — Other Ambulatory Visit: Payer: Self-pay | Admitting: Family Medicine

## 2012-02-15 MED ORDER — CLONAZEPAM 0.5 MG PO TABS: 0.5000 mg | ORAL_TABLET | Freq: Three times a day (TID) | ORAL | Status: DC | PRN

## 2012-02-15 NOTE — Telephone Encounter (Signed)
Noted last refill on 01-19-12 for #90 no refills, MD Tabori gave verbal order to give pt #12 pills to supply her til she can see her psych tomorrow, pt advised via telephone and clarified her pharmacy as [facility], manually faxed signed RX

## 2012-02-15 NOTE — Telephone Encounter (Signed)
90 CLONAZEPAM 0.5 MG TABLETS  0 REFILLS HAS NO REFILLS LEFT REFILL HISTORY : 01/20/12 90

## 2012-02-15 NOTE — Telephone Encounter (Signed)
Patient called back on status of Clonazepam refill request; states she is "going to see psychiatrist tomorrow"/SLS

## 2012-02-17 NOTE — Telephone Encounter (Signed)
Called pt to advise MD Tabori instructions that note the psychiatrist is now in charge of her Clonazepam management now, pt was not sure whom she saw yesterday at her apt, pt did find a number of the office noted as [phone] with [facility] and noted they did not want her to take the Clonazepam any more per dangerous and to attend their therapy sessions instead but pt feels she needs this medication  advised that I will call the office to see what they advised for their plan of care , pt understood.  Called [facility] office and spoke to [place], whom noted pt saw both the psychiatrist and the therapist yesterday, MD Matilde Bash whom advised that the pt STOP taking Clonazepam and start taking the Seroquel and continue to follow up with him as well as utilize the office therapy and counseling to assist, called pt to advised and note that MD Tabori notes the plan of care implemented from MD [facility] and is in total agreement, pt notes kept repeating "I just dont know what I am going to do now, what if I have a panic attack" advised that she can contact [place] office to speak with MD Eulah Pont assistance to discuss her concerns, pt understood and will contact them.MD Beverely Low made aware verbally

## 2012-02-17 NOTE — Telephone Encounter (Signed)
Patient called again reporting that she did see psychiatrist on 07.09.13 and request "to get the rest of her Clonazepam" Rx. Please advise.

## 2012-02-18 NOTE — Telephone Encounter (Signed)
Pt left VM stating that she would like for Dr Beverely Low or someone in this office to give her a call back in reference to her clonazepam. Called Pt back she states that she needs to get her clonazepam fill because this is not a med that you can just stop. Advise Pt per previous note that Dr Beverely Low is no longer refill this med since she is seeing psych/therapist and per phone call to their office they have advise that she is to no longer take this med and changed to Seroquel. Pt indicated that she knows nothing about this but know that you can't just stop this med and not get something else. Pt instructed to contact therapist/psych to further assist her in medication management of this med. Pt ok Verbalized understanding and states that she will contact them and again states that you can't just stop this med and I need my med because it helps with my panic attack and without it I may have withdrawal or possibly a seizure.

## 2012-02-18 NOTE — Telephone Encounter (Signed)
We have told pt multiple times to speak w/ psych about her concerns.  We will not address this issue w/ her.  She was sent to a specialist and we will adhere to their recommendations

## 2012-02-18 NOTE — Telephone Encounter (Signed)
Faxed back request MD Tabori no longer filling medications and patient is aware, faxed went thru sucessful

## 2012-02-19 NOTE — Telephone Encounter (Signed)
Called pt to update if she has spoken with the pysch office to note her concerns, pt advised that she did not call them because they will just tell her the same thing and she needs her medication and she should not just stop taking it, advised that the psych MD is in charge of her current dx that she has noted to require this medication and he advised she no longer needs it, pt noted MD told her that she needs to urge her PCP to give her this medication, advised that I spoke to the pysch MD's nurse and she noted he no longer wants her on this medication and MD Beverely Low advised that he is the expert and will not overide what he says per again he is now in charge of that aspect of her care, urged pt to call psych MD and advise her concerns for stopping the medication and they will address her concerns pt understood and noted she will contact their office,MD Tabori made aware verbally

## 2012-03-07 ENCOUNTER — Other Ambulatory Visit: Payer: Self-pay | Admitting: Family Medicine

## 2012-03-07 NOTE — Telephone Encounter (Signed)
Refill QUEtiapine Fumarate (Tab) SEROQUEL 400 MG Take 1 tablet (400 mg total) by mouth daily. # 30, last fill 6.17.13, last ov 6.5.13 establish care

## 2012-03-08 NOTE — Telephone Encounter (Signed)
Called pharmacy to advise this medication refill needs to be filled from another MD/specialist, pharmacy rep noted pt did pick up RX yesterday noted by MD Lacy Duverney

## 2012-03-11 ENCOUNTER — Telehealth: Payer: Self-pay | Admitting: Family Medicine

## 2012-03-11 NOTE — Telephone Encounter (Signed)
I don't feel comfortable doing this and it has to be picked up anyway.  It can wait until Monday.Marland Kitchen

## 2012-03-11 NOTE — Telephone Encounter (Signed)
Noted  

## 2012-03-11 NOTE — Telephone Encounter (Signed)
Last OV 01-13-12 to Establish Care, last refill 02-10-12 #204 no refills, please note the following notation:  Neena Rhymes, MD 02/10/2012 1:15 PM Signed  Ok for #204, no refills Candie Echevaria, CMA 02/10/2012 1:09 PM Signed  Pt called back to advise that she will see ortho on 03-31-12 but Dr Beverely Low advise she would filled med until appt.

## 2012-03-11 NOTE — Telephone Encounter (Signed)
Refill: 204 Acetaminophen. Last fill 7.3.13 #204

## 2012-03-16 NOTE — Telephone Encounter (Signed)
FYI: No other requests or call from pt noted at this time to request for the refill

## 2012-03-16 NOTE — Telephone Encounter (Signed)
acetaminophen-codeine (TYLENOL #4) was sent on 02-10-12 for #204 no refills to hold pt until ortho apt 03-31-12

## 2012-03-16 NOTE — Telephone Encounter (Signed)
Ok for #204- this will be last fill b/c she will see Ortho later this month

## 2012-03-16 NOTE — Telephone Encounter (Signed)
Is this a tylenol refill or controlled substance request?

## 2012-03-18 MED ORDER — ACETAMINOPHEN-CODEINE 300-60 MG PO TABS: 1.0000 | ORAL_TABLET | ORAL | Status: DC | PRN

## 2012-03-18 NOTE — Telephone Encounter (Signed)
Called pt to advise that her RX for Acetaminophen-Codeine rx has been placed at front desk for pick up, left vm to advise office hours

## 2012-04-06 ENCOUNTER — Encounter: Payer: 59 | Admitting: Family Medicine

## 2012-07-12 ENCOUNTER — Telehealth: Payer: Self-pay | Admitting: Family Medicine

## 2012-07-12 NOTE — Telephone Encounter (Signed)
Fax received from [facility] regarding multiple prescriptions for Clonazepam for this patient. Per Dr. Beverely Low, patient will no longer receive controlled substances from this office.

## 2013-01-02 ENCOUNTER — Encounter (HOSPITAL_COMMUNITY): Payer: Self-pay | Admitting: *Deleted

## 2013-01-02 ENCOUNTER — Emergency Department (HOSPITAL_COMMUNITY)
Admission: EM | Admit: 2013-01-02 | Discharge: 2013-01-02 | Disposition: A | Discharge status: Home / Self Care | Payer: PRIVATE HEALTH INSURANCE | Attending: Emergency Medicine | Admitting: Emergency Medicine

## 2013-01-02 DIAGNOSIS — E079 Disorder of thyroid, unspecified: Secondary | ICD-10-CM | POA: Insufficient documentation

## 2013-01-02 DIAGNOSIS — F411 Generalized anxiety disorder: Secondary | ICD-10-CM | POA: Insufficient documentation

## 2013-01-02 DIAGNOSIS — Z860100 Personal history of colon polyps, unspecified: Secondary | ICD-10-CM | POA: Insufficient documentation

## 2013-01-02 DIAGNOSIS — K219 Gastro-esophageal reflux disease without esophagitis: Secondary | ICD-10-CM | POA: Insufficient documentation

## 2013-01-02 DIAGNOSIS — R110 Nausea: Secondary | ICD-10-CM | POA: Insufficient documentation

## 2013-01-02 DIAGNOSIS — Z8739 Personal history of other diseases of the musculoskeletal system and connective tissue: Secondary | ICD-10-CM | POA: Insufficient documentation

## 2013-01-02 DIAGNOSIS — Z79899 Other long term (current) drug therapy: Secondary | ICD-10-CM | POA: Insufficient documentation

## 2013-01-02 DIAGNOSIS — F17200 Nicotine dependence, unspecified, uncomplicated: Secondary | ICD-10-CM | POA: Insufficient documentation

## 2013-01-02 DIAGNOSIS — G4700 Insomnia, unspecified: Secondary | ICD-10-CM | POA: Insufficient documentation

## 2013-01-02 DIAGNOSIS — Z8659 Personal history of other mental and behavioral disorders: Secondary | ICD-10-CM | POA: Insufficient documentation

## 2013-01-02 DIAGNOSIS — F419 Anxiety disorder, unspecified: Secondary | ICD-10-CM

## 2013-01-02 DIAGNOSIS — J45909 Unspecified asthma, uncomplicated: Secondary | ICD-10-CM | POA: Insufficient documentation

## 2013-01-02 MED ORDER — CLONAZEPAM 0.5 MG PO TABS: 0.5000 mg | ORAL_TABLET | Freq: Three times a day (TID) | ORAL | Status: DC | PRN

## 2013-01-02 MED ORDER — LORAZEPAM 1 MG PO TABS
1.0000 mg | ORAL_TABLET | Freq: Once | ORAL | Status: AC
  Administered 2013-01-02: 1 mg via ORAL
  Filled 2013-01-02: qty 1

## 2013-01-02 NOTE — ED Notes (Signed)
Pt in lobby, asking visitors and pts for money for "bus pass", even though pt was offered a bus pass. Pt then left after receiving money from visitors. Pts sister called back after pt had already left.   [place], rn talked with visitor that gave pt money and apologized. Pt is now off the premises.

## 2013-01-02 NOTE — ED Provider Notes (Signed)
Medical screening examination/treatment/procedure(s) were performed by non-physician practitioner and as supervising physician I was immediately available for consultation/collaboration.   Rolan Bucco, MD 01/02/13 1452

## 2013-01-02 NOTE — ED Notes (Addendum)
Pt reported to chatman rn that she did not have a ride home, wanted to take the ambulance home. Charge rn discussed with pt that taking ambulance home would be very expensive. Pt reports she has no money, pt offered bus pass, pt refused, she thinks bus does not go to her road. Pt contacting neighbor and sister for ride. Pt will wait in lobby and can use phone to contact ride again if needed.

## 2013-01-02 NOTE — ED Provider Notes (Signed)
History     CSN: [id number]  Arrival date & time 01/02/13  4098   First MD Initiated Contact with Patient 01/02/13 [phone]      Chief Complaint  Patient presents with  . panic attack x2 days     (Consider location/radiation/quality/duration/timing/severity/associated sxs/prior treatment) HPI  Patient is a 62 year old female past medical history significant for depression and anxiety presenting to the emergency department with 2 episodes of panic attacks that have not passed as patient does not have any more on medication. Patient states panic attacks are not precipitated by any event or factor. States these two recent panic attacks are with the same symptoms as previous panic attacks with jitteriness, nausea, and anxious feeling. Patient states she is unable to see her primary care doctor until Thursday. Pt denies CP, SOB. Denies recreational drug or alcohol use, suicidal or homicidal ideations, auditory or visual hallucinations.  Past Medical History  Diagnosis Date  . Asthma   . Depression   . GERD (gastroesophageal reflux disease)   . Thyroid disease   . Inflammatory polyps of colon     pt notes flat and is to have surgery to remove   . Osteoarthritis of both knees     Past Surgical History  Procedure Laterality Date  . Abdominal hysterectomy    . Cataract extraction, bilateral    . Shoulder surgery      Family History  Problem Relation Age of Onset  . Cancer Mother   . Cancer Brother   . Cancer Brother     History  Substance Use Topics  . Smoking status: Current Every Day Smoker -- 1.00 packs/day for 40 years  . Smokeless tobacco: Not on file  . Alcohol Use: No    OB History   Grav Para Term Preterm Abortions TAB SAB Ect Mult Living                  Review of Systems  Constitutional: Negative for fever and chills.  Eyes: Negative for visual disturbance.  Respiratory: Negative for shortness of breath.   Cardiovascular: Negative for chest pain.   Neurological: Negative for headaches.  Psychiatric/Behavioral: Positive for sleep disturbance. Negative for suicidal ideas and self-injury. The patient is nervous/anxious.     Allergies  Cortisone  Home Medications   Current Outpatient Rx  Name  Route  Sig  Dispense  Refill  . acetaminophen-codeine (TYLENOL #4) 300-60 MG per tablet   Oral   Take 1-2 tablets by mouth every 4 (four) hours as needed (not to exceed 6 tabs daily).   204 tablet   0   . buPROPion (WELLBUTRIN SR) 100 MG 12 hr tablet   Oral   Take 100 mg by mouth every morning.         . clonazePAM (KLONOPIN) 0.5 MG tablet   Oral   Take 0.5 mg by mouth 3 (three) times daily as needed for anxiety.          Marland Kitchen ibuprofen (ADVIL,MOTRIN) 200 MG tablet   Oral   Take 800 mg by mouth every 8 (eight) hours as needed for headache.         . levothyroxine (SYNTHROID, LEVOTHROID) 100 MCG tablet   Oral   Take 100 mcg by mouth every morning.          Marland Kitchen QUEtiapine (SEROQUEL) 400 MG tablet   Oral   Take 400 mg by mouth at bedtime.         . clonazePAM (KLONOPIN) 0.5  MG tablet   Oral   Take 1 tablet (0.5 mg total) by mouth 3 (three) times daily as needed for anxiety.   12 tablet   0     BP 127/92  Pulse 114  Temp(Src) 97.9 F (36.6 C) (Oral)  Resp 18  Ht 5' 6.5" (1.689 m)  Wt 150 lb (68.04 kg)  BMI 23.85 kg/m2  SpO2 96%  Physical Exam  Constitutional: She is oriented to person, place, and time. She appears well-developed and well-nourished. No distress.  HENT:  Head: Normocephalic and atraumatic.  Eyes: Conjunctivae are normal.  Neck: Neck supple.  Neurological: She is alert and oriented to person, place, and time.  Skin: Skin is warm and dry. She is not diaphoretic.  Psychiatric: Her speech is normal and behavior is normal. Judgment and thought content normal. Her mood appears anxious. Cognition and memory are normal.    ED Course  Procedures (including critical care time)   Date: 01/02/2013   Rate: 94  Rhythm: normal sinus rhythm  QRS Axis: normal  Intervals: normal  ST/T Wave abnormalities: normal  Conduction Disutrbances:none  Narrative Interpretation:   Old EKG Reviewed: none available    Labs Reviewed - No data to display No results found.   1. Anxiety       MDM  Patient presents to the emergency department complaining of symptoms consistent with anxiety.  Patient has a history of same with similar episodes.  The patient is resting comfortably, in no apparent distress and asymptomatic. ECG and vital signs reviewed.  No exophthalmos.  Stress reducing mechanisms discussed including caffeine intake.  Patient has been referred to psychiatric services for follow-up. Patient d/w with Dr. Fredderick Phenix, agrees with plan. Discharged with a 3 day prescription for normal Klonopin home dosing 0.5mg  TID PRN. Patient is stable at time of discharge            Jeannetta Ellis, PA-C 01/02/13 1330

## 2013-01-02 NOTE — ED Notes (Addendum)
ems reports pt hx of anxiety. Panic attack x2 days. Recently switched from klonopin to xanax. Medication ran out 2 days ago. Has appt with pcp on Thursday, could not wait till then.   Addendum:  Pt having diarrhea for two days, can't sleep and experiencing insomnia since Friday night. Pt states that nothing happened to precipitate the panic attacks except being alone and running out of medication.

## 2013-06-01 ENCOUNTER — Ambulatory Visit (INDEPENDENT_AMBULATORY_CARE_PROVIDER_SITE_OTHER): Payer: PRIVATE HEALTH INSURANCE | Admitting: Family Medicine

## 2013-06-01 VITALS — BP 100/64 | HR 134 | Temp 98.4°F | Resp 20 | Ht 66.5 in | Wt 141.0 lb

## 2013-06-01 DIAGNOSIS — F411 Generalized anxiety disorder: Secondary | ICD-10-CM

## 2013-06-01 MED ORDER — CLONAZEPAM 1 MG PO TABS: 1.0000 mg | ORAL_TABLET | Freq: Three times a day (TID) | ORAL | Status: DC | PRN

## 2013-06-01 NOTE — Patient Instructions (Addendum)
Go to [facility] for evaluation now.  I am worried that you will run out of your klonopin which could be DEADLY as you have been taking so much.  Please contact Dr. Clarene Duke and let him know what is going on and I strongly suggest that you let them admit you for withdrawal rather than just continuing on the medicine that isn't working for you (in normal doses).   Please establish w/ one of the psychiatrists belowl  [facility]  [address], [place], [place] [address]  [phone]  [facility]  Address: [address], [place], [place] [address]  [phone]  [facility] - Dr. Allena Katz Address: [address] [place], [place] [address]  [phone]  [facility] [address] [place], [id number] Phone: [phone]
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
## 2013-10-25 ENCOUNTER — Encounter (HOSPITAL_COMMUNITY): Payer: Self-pay | Admitting: Emergency Medicine

## 2013-10-25 ENCOUNTER — Emergency Department (HOSPITAL_COMMUNITY)
Admission: EM | Admit: 2013-10-25 | Discharge: 2013-10-25 | Disposition: A | Discharge status: Home / Self Care | Payer: PRIVATE HEALTH INSURANCE | Attending: Emergency Medicine | Admitting: Emergency Medicine

## 2013-10-25 DIAGNOSIS — R259 Unspecified abnormal involuntary movements: Secondary | ICD-10-CM | POA: Insufficient documentation

## 2013-10-25 DIAGNOSIS — R197 Diarrhea, unspecified: Secondary | ICD-10-CM | POA: Insufficient documentation

## 2013-10-25 DIAGNOSIS — F3289 Other specified depressive episodes: Secondary | ICD-10-CM | POA: Diagnosis not present

## 2013-10-25 DIAGNOSIS — M1710 Unilateral primary osteoarthritis, unspecified knee: Secondary | ICD-10-CM | POA: Diagnosis not present

## 2013-10-25 DIAGNOSIS — E079 Disorder of thyroid, unspecified: Secondary | ICD-10-CM | POA: Diagnosis not present

## 2013-10-25 DIAGNOSIS — F329 Major depressive disorder, single episode, unspecified: Secondary | ICD-10-CM | POA: Insufficient documentation

## 2013-10-25 DIAGNOSIS — F17200 Nicotine dependence, unspecified, uncomplicated: Secondary | ICD-10-CM | POA: Insufficient documentation

## 2013-10-25 DIAGNOSIS — J45909 Unspecified asthma, uncomplicated: Secondary | ICD-10-CM | POA: Insufficient documentation

## 2013-10-25 DIAGNOSIS — Z8719 Personal history of other diseases of the digestive system: Secondary | ICD-10-CM | POA: Insufficient documentation

## 2013-10-25 DIAGNOSIS — Z760 Encounter for issue of repeat prescription: Secondary | ICD-10-CM | POA: Insufficient documentation

## 2013-10-25 DIAGNOSIS — IMO0002 Reserved for concepts with insufficient information to code with codable children: Secondary | ICD-10-CM | POA: Insufficient documentation

## 2013-10-25 DIAGNOSIS — Z87440 Personal history of urinary (tract) infections: Secondary | ICD-10-CM | POA: Diagnosis not present

## 2013-10-25 DIAGNOSIS — Z79899 Other long term (current) drug therapy: Secondary | ICD-10-CM | POA: Diagnosis not present

## 2013-10-25 DIAGNOSIS — Z860100 Personal history of colon polyps, unspecified: Secondary | ICD-10-CM | POA: Insufficient documentation

## 2013-10-25 DIAGNOSIS — F410 Panic disorder [episodic paroxysmal anxiety] without agoraphobia: Secondary | ICD-10-CM | POA: Diagnosis not present

## 2013-10-25 LAB — RAPID URINE DRUG SCREEN, HOSP PERFORMED
Amphetamines: NOT DETECTED
Barbiturates: NOT DETECTED
Benzodiazepines: NOT DETECTED
COCAINE: NOT DETECTED
OPIATES: POSITIVE — AB
Tetrahydrocannabinol: NOT DETECTED

## 2013-10-25 MED ORDER — CLONAZEPAM 0.5 MG PO TABS
1.0000 mg | ORAL_TABLET | Freq: Every day | ORAL | Status: DC
  Administered 2013-10-25: 1 mg via ORAL
  Filled 2013-10-25: qty 2

## 2013-10-25 MED ORDER — CLONAZEPAM 0.125 MG PO TBDP: 1.0000 mg | ORAL_TABLET | Freq: Two times a day (BID) | ORAL | Status: DC

## 2013-10-25 MED ORDER — CLONAZEPAM 1 MG PO TABS: 1.0000 mg | ORAL_TABLET | Freq: Four times a day (QID) | ORAL | Status: DC | PRN

## 2013-10-25 NOTE — ED Provider Notes (Signed)
CSN: [id number]     Arrival date & time 10/25/13  1142 History   First MD Initiated Contact with Patient 10/25/13 1229     Chief Complaint  Patient presents with  . Medication Refill     (Consider location/radiation/quality/duration/timing/severity/associated sxs/prior Treatment) HPI  [name] Is a 63 year old female with a past medical history of depression,, anxiety, panic attacks.  The patient presents emergency department for medication refill.  She states that she takes 1 mg Klonopin and 4 times daily and has been on it for approximately 10 years.  The patient went to her primary care physician Dr. Aida Puffer [place].  She was denied refill of her medications today because her urine was not positive for the medication.  She states that she has a urine drug screen every time she goes for refill of the medication.  She also states that she had had her urine, back negative before and been denied refill.  Patient states that she has been taking her medication as directed.  She shows me the bottle which contained 120 clonazepam 1 mg dispensed on 09/25/2013.  She states she took her medication yesterday and took her last half tablet this morning.  Patient states that she was diagnosed with a urinary tract infection and is being treated.  Patient denies telling her medications.  She complains of tremor, diarrhea, anxiety.  Past Medical History  Diagnosis Date  . Asthma   . Depression   . GERD (gastroesophageal reflux disease)   . Thyroid disease   . Inflammatory polyps of colon     pt notes flat and is to have surgery to remove   . Osteoarthritis of both knees    Past Surgical History  Procedure Laterality Date  . Abdominal hysterectomy    . Cataract extraction, bilateral    . Shoulder surgery     Family History  Problem Relation Age of Onset  . Cancer Mother   . Cancer Brother   . Cancer Brother    History  Substance Use Topics  . Smoking status:  Current Every Day Smoker -- 1.00 packs/day for 40 years  . Smokeless tobacco: Not on file  . Alcohol Use: No   OB History   Grav Para Term Preterm Abortions TAB SAB Ect Mult Living                 Review of Systems  Ten systems reviewed and are negative for acute change, except as noted in the HPI.    Allergies  Review of patient's allergies indicates no known allergies.  Home Medications   Current Outpatient Rx  Name  Route  Sig  Dispense  Refill  . acetaminophen-codeine (TYLENOL #4) 300-60 MG per tablet   Oral   Take 2 tablets by mouth every 6 (six) hours as needed for moderate pain.         Marland Kitchen albuterol (PROVENTIL HFA;VENTOLIN HFA) 108 (90 BASE) MCG/ACT inhaler   Inhalation   Inhale 2 puffs into the lungs every 4 (four) hours as needed for wheezing or shortness of breath.         Marland Kitchen buPROPion (WELLBUTRIN SR) 200 MG 12 hr tablet   Oral   Take 200 mg by mouth daily.         . clonazePAM (KLONOPIN) 1 MG tablet   Oral   Take 0.5-1 mg by mouth 4 (four) times daily.         Marland Kitchen levothyroxine (SYNTHROID, LEVOTHROID) 100  MCG tablet   Oral   Take 100 mcg by mouth daily before breakfast.         . PARoxetine (PAXIL) 10 MG tablet   Oral   Take 10 mg by mouth daily.         . QUEtiapine (SEROQUEL) 400 MG tablet   Oral   Take 600 mg by mouth at bedtime.          BP 153/101  Pulse 113  Temp(Src) 97.6 F (36.4 C) (Oral)  Resp 18  SpO2 100% Physical Exam Physical Exam  Nursing note and vitals reviewed. Constitutional: She is oriented to person, place, and time. She appears well-developed and well-nourished. No distress.  HENT:  Head: Normocephalic and atraumatic.  Eyes: Conjunctivae normal and EOM are normal. Pupils are equal, round, and reactive to light. No scleral icterus.  Neck: Normal range of motion.  Cardiovascular: Normal rate, regular rhythm and normal heart sounds.  Exam reveals no gallop and no friction rub.   No murmur heard. Pulmonary/Chest:  Effort normal and breath sounds normal. No respiratory distress.  Abdominal: Soft. Bowel sounds are normal. She exhibits no distension and no mass. There is no tenderness. There is no guarding.  Neurological: She is alert and oriented to person, place, and time.  Skin: Skin is warm and dry. She is not diaphoretic.    ED Course  Procedures (including critical care time) Labs Review Labs Reviewed  URINE RAPID DRUG SCREEN (HOSP PERFORMED)   Imaging Review No results found.   EKG Interpretation None      MDM   Final diagnoses:  Medication refill    Patient with sxs of withdraw.  I did recheck her uds which is again benzodiazepine negative.  I however did a literature search and found that most drug screens test for Nordiazepam and Oxazepam which ar the metabolites of Diazepam and Temazepam. The metabolite of Clonazepam is 7-aminoclonazepam and is tested for it's ability to cross react with the two aforementioned benzodiazepines, which can frequently lead to false negatives. These results usually show up once sent for mass spectroscopy. (see article- https://www.dunlap.com/http://www.practicalpainmanagement.com/treatments/pharmacological/demystifying-benzodiazepine-urine-drug-screen-results)   I will therefore refill the patient's medication until next tuesday and provide her with the article.  A review of the  drug database show consistent, regular rx from same provider.  The patient appears reasonably screened and/or stabilized for discharge and I doubt any other medical condition or other [facility] requiring further screening, evaluation, or treatment in the ED at this time prior to discharge.     Arthor CaptainAbigail Avon Mergenthaler, PA-C 10/25/13 1344

## 2013-10-25 NOTE — ED Notes (Signed)
Pt is here for a refill on her clonazepam.

## 2013-10-25 NOTE — ED Provider Notes (Signed)
Medical screening examination/treatment/procedure(s) were performed by non-physician practitioner and as supervising physician I was immediately available for consultation/collaboration.   EKG Interpretation None        William Roma Bierlein, MD 10/25/13 1543 

## 2013-10-25 NOTE — Discharge Instructions (Signed)
Medication Refill, Emergency Department We have refilled your medication today as a courtesy to you. It is best for your medical care, however, to take care of getting refills done through your primary caregiver's office. They have your records and can do a better job of follow-up than we can in the emergency department. On maintenance medications, we often only prescribe enough medications to get you by until you are able to see your regular caregiver. This is a more expensive way to refill medications. In the future, please plan for refills so that you will not have to use the emergency department for this. Thank you for your help. Your help allows [place] to better take care of the daily emergencies that enter our department. Document Released: 11/13/2003 Document Revised: 10/19/2011 Document Reviewed: 07/27/2005 [facility] Patient Information 2014 [place], [place]. Clonazepam tablets What is this medicine? CLONAZEPAM (kloe NA ze pam) is a benzodiazepine. It is used to treat certain types of seizures. It is also used to treat panic disorder. This medicine may be used for other purposes; ask your health care provider or pharmacist if you have questions. COMMON BRAND NAME(S): Ceberclon , Klonopin What should I tell my health care provider before I take this medicine? They need to know if you have any of these conditions: -an alcohol or drug abuse problem -bipolar disorder, depression, psychosis or other mental health condition -glaucoma -kidney or liver disease -lung or breathing disease -myasthenia gravis -Parkinson's disease -seizures or a history of seizures -suicidal thoughts -an unusual or allergic reaction to clonazepam, other benzodiazepines, foods, dyes, or preservatives -pregnant or trying to get pregnant -breast-feeding How should I use this medicine? Take this medicine by mouth with a glass of water. Follow the directions on the prescription label. If it upsets your stomach, take it  with food or milk. Take your medicine at regular intervals. Do not take it more often than directed. Do not stop taking or change the dose except on the advice of your doctor or health care professional. A special MedGuide will be given to you by the pharmacist with each prescription and refill. Be sure to read this information carefully each time. Talk to your pediatrician regarding the use of this medicine in children. Special care may be needed. Overdosage: If you think you have taken too much of this medicine contact a poison control center or emergency room at once. NOTE: This medicine is only for you. Do not share this medicine with others. What if I miss a dose? If you miss a dose, take it as soon as you can. If it is almost time for your next dose, take only that dose. Do not take double or extra doses. What may interact with this medicine? -herbal or dietary supplements -medicines for depression, anxiety, or psychotic disturbances -medicines for fungal infections like fluconazole, itraconazole, ketoconazole, voriconazole -medicines for HIV infection or AIDS -medicines for sleep -prescription pain medicines -propantheline -rifampin -sevelamer -some medicines for seizures like carbamazepine, phenobarbital, phenytoin, primidone This list may not describe all possible interactions. Give your health care provider a list of all the medicines, herbs, non-prescription drugs, or dietary supplements you use. Also tell them if you smoke, drink alcohol, or use illegal drugs. Some items may interact with your medicine. What should I watch for while using this medicine? Visit your doctor or health care professional for regular checks on your progress. Your body may become dependent on this medicine. If you have been taking this medicine regularly for some time, do not  suddenly stop taking it. You must gradually reduce the dose or you may get severe side effects. Ask your doctor or health care  professional for advice before increasing or decreasing the dose. Even after you stop taking this medicine it can still affect your body for several days. If you suffer from several types of seizures, this medicine may increase the chance of grand mal seizures (epilepsy). Let your doctor or health care professional know, he or she may want to prescribe an additional medicine. You may get drowsy or dizzy. Do not drive, use machinery, or do anything that needs mental alertness until you know how this medicine affects you. To reduce the risk of dizzy and fainting spells, do not stand or sit up quickly, especially if you are an older patient. Alcohol may increase dizziness and drowsiness. Avoid alcoholic drinks. Do not treat yourself for coughs, colds or allergies without asking your doctor or health care professional for advice. Some ingredients can increase possible side effects. The use of this medicine may increase the chance of suicidal thoughts or actions. Pay special attention to how you are responding while on this medicine. Any worsening of mood, or thoughts of suicide or dying should be reported to your health care professional right away. Women who become pregnant while using this medicine may enroll in the [place] Antiepileptic Drug Pregnancy Registry by calling [phone]. This registry collects information about the safety of antiepileptic drug use during pregnancy. What side effects may I notice from receiving this medicine? Side effects that you should report to your doctor or health care professional as soon as possible: -allergic reactions like skin rash, itching or hives, swelling of the face, lips, or tongue -changes in vision -confusion -depression -hallucinations -mood changes, excitability or aggressive behavior -movement difficulty, staggering or jerky movements -muscle cramps, weakness -tremors -unusual eye movements Side effects that usually do not require medical  attention (report to your doctor or health care professional if they continue or are bothersome): -constipation or diarrhea -difficulty sleeping, nightmares -dizziness, drowsiness -headache -increased saliva from your mouth -nausea, vomiting This list may not describe all possible side effects. Call your doctor for medical advice about side effects. You may report side effects to FDA at 1-800-FDA-1088. Where should I keep my medicine? Keep out of the reach of children. This medicine can be abused. Keep your medicine in a safe place to protect it from theft. Do not share this medicine with anyone. Selling or giving away this medicine is dangerous and against the law. Store at room temperature between 15 and 30 degrees C (59 and 86 degrees F). Protect from light. Keep container tightly closed. Throw away any unused medicine after the expiration date. NOTE: This sheet is a summary. It may not cover all possible information. If you have questions about this medicine, talk to your doctor, pharmacist, or health care provider.  2014, Elsevier/Gold Standard. (2009-06-21 19:16:36)

## 2013-10-25 NOTE — ED Notes (Signed)
Pt states ran out of clonazepam; doctor would not refill it until next Tuesday; states needs medication

## 2014-01-27 ENCOUNTER — Emergency Department (HOSPITAL_COMMUNITY)
Admission: EM | Admit: 2014-01-27 | Discharge: 2014-01-27 | Disposition: A | Discharge status: Home / Self Care | Payer: PRIVATE HEALTH INSURANCE | Attending: Emergency Medicine | Admitting: Emergency Medicine

## 2014-01-27 ENCOUNTER — Encounter (HOSPITAL_COMMUNITY): Payer: Self-pay | Admitting: Emergency Medicine

## 2014-01-27 DIAGNOSIS — Z8719 Personal history of other diseases of the digestive system: Secondary | ICD-10-CM | POA: Insufficient documentation

## 2014-01-27 DIAGNOSIS — Z760 Encounter for issue of repeat prescription: Secondary | ICD-10-CM | POA: Insufficient documentation

## 2014-01-27 DIAGNOSIS — F411 Generalized anxiety disorder: Secondary | ICD-10-CM | POA: Insufficient documentation

## 2014-01-27 DIAGNOSIS — Z79899 Other long term (current) drug therapy: Secondary | ICD-10-CM | POA: Insufficient documentation

## 2014-01-27 DIAGNOSIS — F17200 Nicotine dependence, unspecified, uncomplicated: Secondary | ICD-10-CM | POA: Insufficient documentation

## 2014-01-27 DIAGNOSIS — Z860100 Personal history of colon polyps, unspecified: Secondary | ICD-10-CM | POA: Insufficient documentation

## 2014-01-27 DIAGNOSIS — M1710 Unilateral primary osteoarthritis, unspecified knee: Secondary | ICD-10-CM | POA: Insufficient documentation

## 2014-01-27 DIAGNOSIS — IMO0002 Reserved for concepts with insufficient information to code with codable children: Secondary | ICD-10-CM | POA: Insufficient documentation

## 2014-01-27 DIAGNOSIS — E079 Disorder of thyroid, unspecified: Secondary | ICD-10-CM | POA: Diagnosis not present

## 2014-01-27 DIAGNOSIS — F329 Major depressive disorder, single episode, unspecified: Secondary | ICD-10-CM | POA: Diagnosis not present

## 2014-01-27 DIAGNOSIS — J45909 Unspecified asthma, uncomplicated: Secondary | ICD-10-CM | POA: Insufficient documentation

## 2014-01-27 DIAGNOSIS — F3289 Other specified depressive episodes: Secondary | ICD-10-CM | POA: Diagnosis not present

## 2014-01-27 DIAGNOSIS — F419 Anxiety disorder, unspecified: Secondary | ICD-10-CM

## 2014-01-27 MED ORDER — CLONAZEPAM 1 MG PO TABS: 1.0000 mg | ORAL_TABLET | Freq: Four times a day (QID) | ORAL | Status: DC

## 2014-01-27 MED ORDER — CLONAZEPAM 0.5 MG PO TABS
1.0000 mg | ORAL_TABLET | Freq: Once | ORAL | Status: AC
  Administered 2014-01-27: 1 mg via ORAL
  Filled 2014-01-27 (×2): qty 2

## 2014-01-27 NOTE — ED Provider Notes (Signed)
CSN: [id number]     Arrival date & time 01/27/14  1135 History  This chart was scribed for non-physician practitioner, Fayrene HelperBowie Tran, PA-C, working with Rolland PorterMark James, MD, by Ardelia Memsylan Malpass ED Scribe. This patient was seen in room WTR6/WTR6 and the patient's care was started at 12:17 PM.   Chief Complaint  Patient presents with  . Anxiety  . Medication Refill    The history is provided by the patient. No language interpreter was used.    HPI Comments: [name] is a 63 y.o. female with a history of anxiety and depression brought by EMS to the Emergency Department requesting a medication refill. She states that she has been having anxiety and panic attacks. She states that she is prescribed Klonopin, but that she has ran out of the medication, and will not be able to get a refill from her PCP until 3 days from now. She states that she is prescribed 1 mg Xanax, 4x daily. She denies any SI, HI or hallucinations. She denies using any illicit drugs or alcohol.   Past Medical History  Diagnosis Date  . Asthma   . Depression   . GERD (gastroesophageal reflux disease)   . Thyroid disease   . Inflammatory polyps of colon     pt notes flat and is to have surgery to remove   . Osteoarthritis of both knees    Past Surgical History  Procedure Laterality Date  . Abdominal hysterectomy    . Cataract extraction, bilateral    . Shoulder surgery     Family History  Problem Relation Age of Onset  . Cancer Mother   . Cancer Brother   . Cancer Brother    History  Substance Use Topics  . Smoking status: Current Every Day Smoker -- 1.00 packs/day for 40 years    Types: Cigarettes  . Smokeless tobacco: Not on file  . Alcohol Use: No   OB History   Grav Para Term Preterm Abortions TAB SAB Ect Mult Living                 Review of Systems  Psychiatric/Behavioral: Negative for suicidal ideas and hallucinations. The patient is nervous/anxious.        Denies HI    Allergies  Review of  patient's allergies indicates no known allergies.  Home Medications   Prior to Admission medications   Medication Sig Start Date End Date Taking? Authorizing Detavious Rinn  acetaminophen-codeine (TYLENOL #4) 300-60 MG per tablet Take 2 tablets by mouth every 6 (six) hours as needed for moderate pain.    Historical Maybelline Kolarik, MD  albuterol (PROVENTIL HFA;VENTOLIN HFA) 108 (90 BASE) MCG/ACT inhaler Inhale 2 puffs into the lungs every 4 (four) hours as needed for wheezing or shortness of breath.    Historical Jalessa Peyser, MD  buPROPion (WELLBUTRIN SR) 200 MG 12 hr tablet Take 200 mg by mouth daily.    Historical Dary Dilauro, MD  clonazePAM (KLONOPIN) 1 MG tablet Take 0.5-1 mg by mouth 4 (four) times daily.    Historical Kayron Kalmar, MD  clonazePAM (KLONOPIN) 1 MG tablet Take 1 tablet (1 mg total) by mouth 4 (four) times daily as needed for anxiety. 10/25/13   Arthor CaptainAbigail Harris, PA-C  levothyroxine (SYNTHROID, LEVOTHROID) 100 MCG tablet Take 100 mcg by mouth daily before breakfast.    Historical Orestes Geiman, MD  PARoxetine (PAXIL) 10 MG tablet Take 10 mg by mouth daily.    Historical Timarion Agcaoili, MD  QUEtiapine (SEROQUEL) 400 MG tablet Take 600 mg by  mouth at bedtime.    Historical Joelyn Lover, MD   Triage Vitals: BP 145/91  Pulse 115  Temp(Src) 98.7 F (37.1 C) (Oral)  Resp 19  SpO2 97%  Physical Exam  Nursing note and vitals reviewed. Constitutional: She is oriented to person, place, and time. She appears well-developed and well-nourished. No distress.  HENT:  Head: Normocephalic and atraumatic.  Eyes: Conjunctivae and EOM are normal.  Neck: Neck supple. No tracheal deviation present.  Cardiovascular: Normal rate.   Pulmonary/Chest: Effort normal. No respiratory distress.  Musculoskeletal: Normal range of motion.  Neurological: She is alert and oriented to person, place, and time.  Skin: Skin is warm and dry.  Psychiatric: She has a normal mood and affect. Her behavior is normal.    ED Course  Procedures  (including critical care time)  DIAGNOSTIC STUDIES: Oxygen Saturation is 97% on RA, normal by my interpretation.    COORDINATION OF CARE: 12:21 PM- Discussed plan to prescribe pt a small amount of Klonopin until her appointment with her PCP on Tuesday. Will also give pt a dose of Klonopin while she is in the ED. Pt advised of plan for treatment and pt agrees.  Labs Review Labs Reviewed - No data to display  Imaging Review No results found.   EKG Interpretation None      MDM   Final diagnoses:  Encounter for medication refill  Anxiety    BP 145/91  Pulse 115  Temp(Src) 98.7 F (37.1 C) (Oral)  Resp 19  SpO2 97%  I personally performed the services described in this documentation, which was scribed in my presence. The recorded information has been reviewed and is accurate.     Fayrene HelperBowie Tran, PA-C 01/27/14 2009

## 2014-01-27 NOTE — ED Notes (Signed)
Pt from home via EMS c/o "panic attack." Pt states that last year her brother died and since she has been under a lot of stress. Pt states that she only has 1/2 of a Klonopin left and she feels that she cannot go until Tuesday w/o her meds. Pt has no other c/o, is A&O and in NAD.

## 2014-01-27 NOTE — Discharge Instructions (Signed)
Medication Refill, Emergency Department We have refilled your medication today as a courtesy to you. It is best for your medical care, however, to take care of getting refills done through your primary caregiver's office. They have your records and can do a better job of follow-up than we can in the emergency department. On maintenance medications, we often only prescribe enough medications to get you by until you are able to see your regular caregiver. This is a more expensive way to refill medications. In the future, please plan for refills so that you will not have to use the emergency department for this. Thank you for your help. Your help allows us to better take care of the daily emergencies that enter our department. Document Released: 11/13/2003 Document Revised: 10/19/2011 Document Reviewed: 07/27/2005 [facility] Patient Information 2015 [place], [place]. This information is not intended to replace advice given to you by your health care provider. Make sure you discuss any questions you have with your health care provider.  Panic Attacks Panic attacks are sudden, short-livedsurges of severe anxiety, fear, or discomfort. They may occur for no reason when you are relaxed, when you are anxious, or when you are sleeping. Panic attacks may occur for a number of reasons:   Healthy people occasionally have panic attacks in extreme, life-threatening situations, such as war or natural disasters. Normal anxiety is a protective mechanism of the body that helps us react to danger (fight or flight response).  Panic attacks are often seen with anxiety disorders, such as panic disorder, social anxiety disorder, generalized anxiety disorder, and phobias. Anxiety disorders cause excessive or uncontrollable anxiety. They may interfere with your relationships or other life activities.  Panic attacks are sometimes seen with other mental illnesses, such as depression and posttraumatic stress disorder.  Certain  medical conditions, prescription medicines, and drugs of abuse can cause panic attacks. SYMPTOMS  Panic attacks start suddenly, peak within 20 minutes, and are accompanied by four or more of the following symptoms:  Pounding heart or fast heart rate (palpitations).  Sweating.  Trembling or shaking.  Shortness of breath or feeling smothered.  Feeling choked.  Chest pain or discomfort.  Nausea or strange feeling in your stomach.  Dizziness, light-headedness, or feeling like you will faint.  Chills or hot flushes.  Numbness or tingling in your lips or hands and feet.  Feeling that things are not real or feeling that you are not yourself.  Fear of losing control or going crazy.  Fear of dying. Some of these symptoms can mimic serious medical conditions. For example, you may think you are having a heart attack. Although panic attacks can be very scary, they are not life threatening. DIAGNOSIS  Panic attacks are diagnosed through an assessment by your health care provider. Your health care provider will ask questions about your symptoms, such as where and when they occurred. Your health care provider will also ask about your medical history and use of alcohol and drugs, including prescription medicines. Your health care provider may order blood tests or other studies to rule out a serious medical condition. Your health care provider may refer you to a mental health professional for further evaluation. TREATMENT   Most healthy people who have one or two panic attacks in an extreme, life-threatening situation will not require treatment.  The treatment for panic attacks associated with anxiety disorders or other mental illness typically involves counseling with a mental health professional, medicine, or a combination of both. Your health care provider will help  determine what treatment is best for you.  Panic attacks due to physical illness usually go away with treatment of the illness.  If prescription medicine is causing panic attacks, talk with your health care provider about stopping the medicine, decreasing the dose, or substituting another medicine.  Panic attacks due to alcohol or drug abuse go away with abstinence. Some adults need professional help in order to stop drinking or using drugs. HOME CARE INSTRUCTIONS   Take all medicines as directed by your health care provider.   Schedule and attend follow-up visits as directed by your health care provider. It is important to keep all your appointments. SEEK MEDICAL CARE IF:  You are not able to take your medicines as prescribed.  Your symptoms do not improve or get worse. SEEK IMMEDIATE MEDICAL CARE IF:   You experience panic attack symptoms that are different than your usual symptoms.  You have serious thoughts about hurting yourself or others.  You are taking medicine for panic attacks and have a serious side effect. MAKE SURE YOU:  Understand these instructions.  Will watch your condition.  Will get help right away if you are not doing well or get worse. Document Released: 07/27/2005 Document Revised: 08/01/2013 Document Reviewed: 03/10/2013 [facility] Patient Information 2015 [place], [place]. This information is not intended to replace advice given to you by your health care provider. Make sure you discuss any questions you have with your health care provider.

## 2014-01-27 NOTE — ED Notes (Signed)
Per pt, states increased anxiety-out of klonopin

## 2014-01-31 NOTE — ED Provider Notes (Signed)
Medical screening examination/treatment/procedure(s) were performed by non-physician practitioner and as supervising physician I was immediately available for consultation/collaboration.   EKG Interpretation None        Rolland PorterMark James, MD 01/31/14 [phone]

## 2014-02-27 ENCOUNTER — Emergency Department (HOSPITAL_COMMUNITY)
Admission: EM | Admit: 2014-02-27 | Discharge: 2014-02-27 | Disposition: A | Discharge status: Home / Self Care | Payer: PRIVATE HEALTH INSURANCE | Attending: Emergency Medicine | Admitting: Emergency Medicine

## 2014-02-27 ENCOUNTER — Encounter (HOSPITAL_COMMUNITY): Payer: Self-pay | Admitting: Emergency Medicine

## 2014-02-27 DIAGNOSIS — J45909 Unspecified asthma, uncomplicated: Secondary | ICD-10-CM | POA: Insufficient documentation

## 2014-02-27 DIAGNOSIS — Z860100 Personal history of colon polyps, unspecified: Secondary | ICD-10-CM | POA: Insufficient documentation

## 2014-02-27 DIAGNOSIS — F419 Anxiety disorder, unspecified: Secondary | ICD-10-CM

## 2014-02-27 DIAGNOSIS — Z8719 Personal history of other diseases of the digestive system: Secondary | ICD-10-CM | POA: Insufficient documentation

## 2014-02-27 DIAGNOSIS — F411 Generalized anxiety disorder: Secondary | ICD-10-CM | POA: Insufficient documentation

## 2014-02-27 DIAGNOSIS — E079 Disorder of thyroid, unspecified: Secondary | ICD-10-CM | POA: Insufficient documentation

## 2014-02-27 DIAGNOSIS — Z8739 Personal history of other diseases of the musculoskeletal system and connective tissue: Secondary | ICD-10-CM | POA: Insufficient documentation

## 2014-02-27 DIAGNOSIS — Z79899 Other long term (current) drug therapy: Secondary | ICD-10-CM | POA: Insufficient documentation

## 2014-02-27 DIAGNOSIS — F3289 Other specified depressive episodes: Secondary | ICD-10-CM | POA: Insufficient documentation

## 2014-02-27 DIAGNOSIS — R000 Tachycardia, unspecified: Secondary | ICD-10-CM | POA: Insufficient documentation

## 2014-02-27 DIAGNOSIS — F17200 Nicotine dependence, unspecified, uncomplicated: Secondary | ICD-10-CM | POA: Insufficient documentation

## 2014-02-27 DIAGNOSIS — F329 Major depressive disorder, single episode, unspecified: Secondary | ICD-10-CM | POA: Insufficient documentation

## 2014-02-27 MED ORDER — CLONAZEPAM 0.5 MG PO TABS
1.0000 mg | ORAL_TABLET | Freq: Once | ORAL | Status: AC
  Administered 2014-02-27: 1 mg via ORAL
  Filled 2014-02-27: qty 2

## 2014-02-27 MED ORDER — ACETAMINOPHEN-CODEINE #4 300-60 MG PO TABS
1.0000 | ORAL_TABLET | Freq: Once | ORAL | Status: AC
  Administered 2014-02-27: 1 via ORAL
  Filled 2014-02-27: qty 1

## 2014-02-27 NOTE — Discharge Instructions (Signed)
1. Medications: usual home medications 2. Treatment: rest, drink plenty of fluids,  3. Follow Up: Please followup with your primary doctor for discussion of your diagnoses and further evaluation after today's visit; if you do not have a primary care doctor use the resource guide provided to find one;     Panic Attacks Panic attacks are sudden, short-livedsurges of severe anxiety, fear, or discomfort. They may occur for no reason when you are relaxed, when you are anxious, or when you are sleeping. Panic attacks may occur for a number of reasons:   Healthy people occasionally have panic attacks in extreme, life-threatening situations, such as war or natural disasters. Normal anxiety is a protective mechanism of the body that helps [place] react to danger (fight or flight response).  Panic attacks are often seen with anxiety disorders, such as panic disorder, social anxiety disorder, generalized anxiety disorder, and phobias. Anxiety disorders cause excessive or uncontrollable anxiety. They may interfere with your relationships or other life activities.  Panic attacks are sometimes seen with other mental illnesses, such as depression and posttraumatic stress disorder.  Certain medical conditions, prescription medicines, and drugs of abuse can cause panic attacks. SYMPTOMS  Panic attacks start suddenly, peak within 20 minutes, and are accompanied by four or more of the following symptoms:  Pounding heart or fast heart rate (palpitations).  Sweating.  Trembling or shaking.  Shortness of breath or feeling smothered.  Feeling choked.  Chest pain or discomfort.  Nausea or strange feeling in your stomach.  Dizziness, light-headedness, or feeling like you will faint.  Chills or hot flushes.  Numbness or tingling in your lips or hands and feet.  Feeling that things are not real or feeling that you are not yourself.  Fear of losing control or going crazy.  Fear of dying. Some of these  symptoms can mimic serious medical conditions. For example, you may think you are having a heart attack. Although panic attacks can be very scary, they are not life threatening. DIAGNOSIS  Panic attacks are diagnosed through an assessment by your health care provider. Your health care provider will ask questions about your symptoms, such as where and when they occurred. Your health care provider will also ask about your medical history and use of alcohol and drugs, including prescription medicines. Your health care provider may order blood tests or other studies to rule out a serious medical condition. Your health care provider may refer you to a mental health professional for further evaluation. TREATMENT   Most healthy people who have one or two panic attacks in an extreme, life-threatening situation will not require treatment.  The treatment for panic attacks associated with anxiety disorders or other mental illness typically involves counseling with a mental health professional, medicine, or a combination of both. Your health care provider will help determine what treatment is best for you.  Panic attacks due to physical illness usually go away with treatment of the illness. If prescription medicine is causing panic attacks, talk with your health care provider about stopping the medicine, decreasing the dose, or substituting another medicine.  Panic attacks due to alcohol or drug abuse go away with abstinence. Some adults need professional help in order to stop drinking or using drugs. HOME CARE INSTRUCTIONS   Take all medicines as directed by your health care provider.   Schedule and attend follow-up visits as directed by your health care provider. It is important to keep all your appointments. SEEK MEDICAL CARE IF:  You are not  able to take your medicines as prescribed.  Your symptoms do not improve or get worse. SEEK IMMEDIATE MEDICAL CARE IF:   You experience panic attack symptoms  that are different than your usual symptoms.  You have serious thoughts about hurting yourself or others.  You are taking medicine for panic attacks and have a serious side effect. MAKE SURE YOU:  Understand these instructions.  Will watch your condition.  Will get help right away if you are not doing well or get worse. Document Released: 07/27/2005 Document Revised: 08/01/2013 Document Reviewed: 03/10/2013 [facility] Patient Information 2015 [place], [place]. This information is not intended to replace advice given to you by your health care provider. Make sure you discuss any questions you have with your health care provider.

## 2014-02-27 NOTE — ED Notes (Signed)
Pt states she went to her dr to get her meds filled and he wanted to do a urine test and she states her urine came back abnormal so he would not refill her clonazapam or tylenol #3  Pt states she goes to him every Tuesday for medication refills  This has caused her to have a panic attack

## 2014-02-27 NOTE — ED Notes (Signed)
Pt states her dr is Dr Clarene DukeLittle  Pt states she did take a dose of her medication earlier today  Pt states she feels like she is going to jump out of her skin

## 2014-02-27 NOTE — ED Provider Notes (Signed)
CSN: [id number]     Arrival date & time 02/27/14  1858 History   None    This chart was scribed for non-physician practitioner, Dierdre ForthHannah Zalea Pete PA-C working with Toy BakerAnthony T Allen, MD by Arlan OrganAshley Leger, ED Scribe. This patient was seen in room WTR7/WTR7 and the patient's care was started at 7:50 PM.   Chief Complaint  Patient presents with  . Panic Attack   The history is provided by the patient and medical records. No language interpreter was used.    HPI Comments: [name] is a 63 y.o. female with a PMHx of asthma, depression, GERD, and thyroid disease who presents to the Emergency Department complaining of a panic attack onset today. States she feels as though "i am going to jump out of my skin". Pt states she is a current pt of Dr. Clarene DukeLittle and was evaluated for her weekly refill of her medications today. Pt states a urine sample was taken during today's visit with abnormal results. She states as a result, Dr. Clarene DukeLittle would not fill his Clonazepam and Tylenol #4. States this issue will not be resolved for another week. She currently takes her Clonazepam 3 times daily and took her last dose earlier this afternoon. She feels as though she can not go more than 2-3 days without her medications. At this time she denies any fever, chills, SOB or abdominal pain. She has no other pertinent past medical history. No other concerns this visit.  Past Medical History  Diagnosis Date  . Asthma   . Depression   . GERD (gastroesophageal reflux disease)   . Thyroid disease   . Inflammatory polyps of colon     pt notes flat and is to have surgery to remove   . Osteoarthritis of both knees    Past Surgical History  Procedure Laterality Date  . Abdominal hysterectomy    . Cataract extraction, bilateral    . Shoulder surgery     Family History  Problem Relation Age of Onset  . Cancer Mother   . Cancer Brother   . Cancer Brother    History  Substance Use Topics  . Smoking status: Current  Every Day Smoker -- 1.00 packs/day for 40 years    Types: Cigarettes  . Smokeless tobacco: Not on file  . Alcohol Use: No   OB History   Grav Para Term Preterm Abortions TAB SAB Ect Mult Living                 Review of Systems  Constitutional: Negative for fever, chills, diaphoresis, appetite change, fatigue and unexpected weight change.  HENT: Negative for mouth sores.   Eyes: Negative for visual disturbance.  Respiratory: Negative for cough, chest tightness, shortness of breath and wheezing.   Cardiovascular: Negative for chest pain.  Gastrointestinal: Negative for nausea, vomiting, abdominal pain, diarrhea and constipation.  Endocrine: Negative for polydipsia, polyphagia and polyuria.  Genitourinary: Negative for dysuria, urgency, frequency and hematuria.  Musculoskeletal: Negative for back pain and neck stiffness.  Skin: Negative for rash.  Allergic/Immunologic: Negative for immunocompromised state.  Neurological: Negative for syncope, light-headedness and headaches.  Hematological: Does not bruise/bleed easily.  Psychiatric/Behavioral: Negative for confusion and sleep disturbance. The patient is nervous/anxious.       Allergies  Review of patient's allergies indicates no known allergies.  Home Medications   Prior to Admission medications   Medication Sig Start Date End Date Taking? Authorizing Provider  acetaminophen-codeine (TYLENOL #4) 300-60 MG per tablet Take 2 tablets  by mouth every 6 (six) hours as needed for moderate pain.    Historical Provider, MD  albuterol (PROVENTIL HFA;VENTOLIN HFA) 108 (90 BASE) MCG/ACT inhaler Inhale 2 puffs into the lungs every 4 (four) hours as needed for wheezing or shortness of breath.    Historical Provider, MD  buPROPion (WELLBUTRIN SR) 200 MG 12 hr tablet Take 200 mg by mouth daily.    Historical Provider, MD  clonazePAM (KLONOPIN) 1 MG tablet Take 1 tablet (1 mg total) by mouth 4 (four) times daily as needed for anxiety. 10/25/13    Arthor Captain, PA-C  clonazePAM (KLONOPIN) 1 MG tablet Take 1 tablet (1 mg total) by mouth 4 (four) times daily. 01/27/14   Fayrene Helper, PA-C  levothyroxine (SYNTHROID, LEVOTHROID) 100 MCG tablet Take 100 mcg by mouth daily before breakfast.    Historical Provider, MD  PARoxetine (PAXIL) 10 MG tablet Take 10 mg by mouth daily.    Historical Provider, MD  QUEtiapine (SEROQUEL) 400 MG tablet Take 600 mg by mouth at bedtime.    Historical Provider, MD   Triage Vitals: BP 132/91  Pulse 117  Temp(Src) 98.3 F (36.8 C) (Oral)  Resp 20  SpO2 96%   Physical Exam  Nursing note and vitals reviewed. Constitutional: She is oriented to person, place, and time. She appears well-developed and well-nourished. No distress.  Awake, alert, nontoxic appearance  HENT:  Head: Normocephalic and atraumatic.  Mouth/Throat: Oropharynx is clear and moist. No oropharyngeal exudate.  Eyes: Conjunctivae are normal. No scleral icterus.  Neck: Normal range of motion. Neck supple.  Cardiovascular: Regular rhythm, normal heart sounds and intact distal pulses.   No murmur heard. Mild tachycardia  Pulmonary/Chest: Effort normal and breath sounds normal. No respiratory distress. She has no wheezes.  Abdominal: Soft. Bowel sounds are normal. She exhibits no mass. There is no tenderness. There is no rebound and no guarding.  Musculoskeletal: Normal range of motion. She exhibits no edema.  Neurological: She is alert and oriented to person, place, and time. She exhibits normal muscle tone. Coordination normal.  Speech is clear and goal oriented Moves extremities without ataxia  Skin: Skin is warm and dry. No rash noted. She is not diaphoretic. No erythema.  Psychiatric: Her mood appears anxious.  Pt is upset and appears anxious    ED Course  Procedures (including critical care time)  DIAGNOSTIC STUDIES: Oxygen Saturation is 96% on RA, adequate by my interpretation.    COORDINATION OF CARE: 8:44 PM-Discussed  treatment plan with pt at bedside and pt agreed to plan.     Labs Review Labs Reviewed - No data to display  Imaging Review No results found.   EKG Interpretation None      MDM   Final diagnoses:  Anxiety   RUMI KOLODZIEJ presents for refill of her Clonazepam and Acetaminophen #4.  Pt reports after an abnormal UA, her medications were not refilled.  Pt reports feeling anxious at this time. She appears calm on exam with mild tachycardia.  She reports last clonazepam was mid-day.    [place] Database was accessed and pt's last refill was on 02/20/14 for 8 days.  Pt is on a pain contract.  I will therefore give Tylenol #4 and Clonazepam in the ED but will no write an Rx or either of these.  Pt states understanding of the situation.    Pt with tachycardia, improving and likely 2/2 to anxiety and frustration about not receiving her benzo Rx.   BP 138/98  Pulse 106  Temp(Src) 98.3 F (36.8 C) (Oral)  Resp 18  SpO2 96%   I personally performed the services described in this documentation, which was scribed in my presence. The recorded information has been reviewed and is accurate.    Dahlia Client Yahia Bottger, PA-C 02/27/14 2045

## 2014-02-28 NOTE — ED Provider Notes (Signed)
Medical screening examination/treatment/procedure(s) were performed by non-physician practitioner and as supervising physician I was immediately available for consultation/collaboration.   Toy BakerAnthony T Dustyn Armbrister, MD 02/28/14 1714

## 2014-03-06 ENCOUNTER — Encounter (HOSPITAL_COMMUNITY): Payer: Self-pay | Admitting: Emergency Medicine

## 2014-03-06 ENCOUNTER — Emergency Department (HOSPITAL_COMMUNITY): Payer: PRIVATE HEALTH INSURANCE

## 2014-03-06 ENCOUNTER — Inpatient Hospital Stay (HOSPITAL_COMMUNITY)
Admission: EM | Admit: 2014-03-06 | Discharge: 2014-03-08 | DRG: 896 | Disposition: A | Discharge status: Skilled Nursing Facility | Payer: PRIVATE HEALTH INSURANCE | Attending: Internal Medicine | Admitting: Internal Medicine

## 2014-03-06 DIAGNOSIS — J45909 Unspecified asthma, uncomplicated: Secondary | ICD-10-CM | POA: Diagnosis present

## 2014-03-06 DIAGNOSIS — R4701 Aphasia: Secondary | ICD-10-CM | POA: Diagnosis present

## 2014-03-06 DIAGNOSIS — F411 Generalized anxiety disorder: Secondary | ICD-10-CM | POA: Diagnosis present

## 2014-03-06 DIAGNOSIS — F3132 Bipolar disorder, current episode depressed, moderate: Secondary | ICD-10-CM

## 2014-03-06 DIAGNOSIS — F13231 Sedative, hypnotic or anxiolytic dependence with withdrawal delirium: Secondary | ICD-10-CM | POA: Diagnosis present

## 2014-03-06 DIAGNOSIS — F1320 Sedative, hypnotic or anxiolytic dependence, uncomplicated: Secondary | ICD-10-CM

## 2014-03-06 DIAGNOSIS — F19939 Other psychoactive substance use, unspecified with withdrawal, unspecified: Secondary | ICD-10-CM | POA: Diagnosis not present

## 2014-03-06 DIAGNOSIS — M1710 Unilateral primary osteoarthritis, unspecified knee: Secondary | ICD-10-CM | POA: Diagnosis present

## 2014-03-06 DIAGNOSIS — K219 Gastro-esophageal reflux disease without esophagitis: Secondary | ICD-10-CM | POA: Diagnosis present

## 2014-03-06 DIAGNOSIS — R404 Transient alteration of awareness: Secondary | ICD-10-CM | POA: Diagnosis present

## 2014-03-06 DIAGNOSIS — F3130 Bipolar disorder, current episode depressed, mild or moderate severity, unspecified: Secondary | ICD-10-CM | POA: Diagnosis present

## 2014-03-06 DIAGNOSIS — G40401 Other generalized epilepsy and epileptic syndromes, not intractable, with status epilepticus: Secondary | ICD-10-CM | POA: Diagnosis present

## 2014-03-06 DIAGNOSIS — Z720 Tobacco use: Secondary | ICD-10-CM

## 2014-03-06 DIAGNOSIS — N390 Urinary tract infection, site not specified: Secondary | ICD-10-CM | POA: Diagnosis present

## 2014-03-06 DIAGNOSIS — E038 Other specified hypothyroidism: Secondary | ICD-10-CM

## 2014-03-06 DIAGNOSIS — F17200 Nicotine dependence, unspecified, uncomplicated: Secondary | ICD-10-CM | POA: Diagnosis present

## 2014-03-06 DIAGNOSIS — E039 Hypothyroidism, unspecified: Secondary | ICD-10-CM | POA: Diagnosis present

## 2014-03-06 DIAGNOSIS — F13931 Sedative, hypnotic or anxiolytic use, unspecified with withdrawal delirium: Secondary | ICD-10-CM

## 2014-03-06 DIAGNOSIS — F319 Bipolar disorder, unspecified: Secondary | ICD-10-CM | POA: Diagnosis present

## 2014-03-06 DIAGNOSIS — F19921 Other psychoactive substance use, unspecified with intoxication with delirium: Secondary | ICD-10-CM

## 2014-03-06 DIAGNOSIS — R569 Unspecified convulsions: Secondary | ICD-10-CM

## 2014-03-06 HISTORY — DX: Sedative, hypnotic or anxiolytic use, unspecified with withdrawal delirium: F13.931

## 2014-03-06 HISTORY — DX: Unspecified convulsions: R56.9

## 2014-03-06 HISTORY — DX: Hypothyroidism, unspecified: E03.9

## 2014-03-06 HISTORY — DX: Personal history of other diseases of the digestive system: Z87.19

## 2014-03-06 HISTORY — DX: Anxiety disorder, unspecified: F41.9

## 2014-03-06 HISTORY — DX: Sedative, hypnotic or anxiolytic dependence with withdrawal delirium: F13.231

## 2014-03-06 HISTORY — DX: Bipolar disorder, unspecified: F31.9

## 2014-03-06 LAB — URINALYSIS, ROUTINE W REFLEX MICROSCOPIC
BILIRUBIN URINE: NEGATIVE
Glucose, UA: NEGATIVE mg/dL
KETONES UR: 40 mg/dL — AB
Leukocytes, UA: NEGATIVE
NITRITE: POSITIVE — AB
PH: 6.5 (ref 5.0–8.0)
Protein, ur: NEGATIVE mg/dL
Specific Gravity, Urine: 1.016 (ref 1.005–1.030)
Urobilinogen, UA: 0.2 mg/dL (ref 0.0–1.0)

## 2014-03-06 LAB — URINE MICROSCOPIC-ADD ON

## 2014-03-06 LAB — I-STAT CHEM 8, ED
BUN: 9 mg/dL (ref 6–23)
CALCIUM ION: 1.12 mmol/L — AB (ref 1.13–1.30)
CHLORIDE: 96 meq/L (ref 96–112)
CREATININE: 0.9 mg/dL (ref 0.50–1.10)
GLUCOSE: 104 mg/dL — AB (ref 70–99)
HCT: 45 % (ref 36.0–46.0)
Hemoglobin: 15.3 g/dL — ABNORMAL HIGH (ref 12.0–15.0)
Potassium: 3.5 mEq/L — ABNORMAL LOW (ref 3.7–5.3)
Sodium: 130 mEq/L — ABNORMAL LOW (ref 137–147)
TCO2: 18 mmol/L (ref 0–100)

## 2014-03-06 LAB — CBC WITH DIFFERENTIAL/PLATELET
BASOS ABS: 0 10*3/uL (ref 0.0–0.1)
Basophils Relative: 0 % (ref 0–1)
EOS PCT: 0 % (ref 0–5)
Eosinophils Absolute: 0 10*3/uL (ref 0.0–0.7)
HEMATOCRIT: 40 % (ref 36.0–46.0)
Hemoglobin: 13.9 g/dL (ref 12.0–15.0)
LYMPHS PCT: 11 % — AB (ref 12–46)
Lymphs Abs: 0.8 10*3/uL (ref 0.7–4.0)
MCH: 33.8 pg (ref 26.0–34.0)
MCHC: 34.8 g/dL (ref 30.0–36.0)
MCV: 97.3 fL (ref 78.0–100.0)
MONO ABS: 0.6 10*3/uL (ref 0.1–1.0)
Monocytes Relative: 8 % (ref 3–12)
Neutro Abs: 6.3 10*3/uL (ref 1.7–7.7)
Neutrophils Relative %: 81 % — ABNORMAL HIGH (ref 43–77)
Platelets: 247 10*3/uL (ref 150–400)
RBC: 4.11 MIL/uL (ref 3.87–5.11)
RDW: 13.2 % (ref 11.5–15.5)
WBC: 7.7 10*3/uL (ref 4.0–10.5)

## 2014-03-06 LAB — RAPID URINE DRUG SCREEN, HOSP PERFORMED
AMPHETAMINES: NOT DETECTED
BARBITURATES: NOT DETECTED
BENZODIAZEPINES: POSITIVE — AB
Cocaine: NOT DETECTED
Opiates: NOT DETECTED
TETRAHYDROCANNABINOL: NOT DETECTED

## 2014-03-06 MED ORDER — OXYCODONE HCL 5 MG PO TABS: 5.0000 mg | ORAL_TABLET | ORAL | Status: DC | PRN

## 2014-03-06 MED ORDER — QUETIAPINE FUMARATE 300 MG PO TABS
600.0000 mg | ORAL_TABLET | Freq: Every day | ORAL | Status: DC
  Administered 2014-03-07 (×2): 600 mg via ORAL
  Filled 2014-03-06 (×3): qty 2

## 2014-03-06 MED ORDER — ALUM & MAG HYDROXIDE-SIMETH 200-200-20 MG/5ML PO SUSP: 30.0000 mL | Freq: Four times a day (QID) | ORAL | Status: DC | PRN

## 2014-03-06 MED ORDER — BUPROPION HCL ER (SR) 100 MG PO TB12
200.0000 mg | ORAL_TABLET | Freq: Every day | ORAL | Status: DC
  Administered 2014-03-07 – 2014-03-08 (×2): 200 mg via ORAL
  Filled 2014-03-06 (×2): qty 2

## 2014-03-06 MED ORDER — SODIUM CHLORIDE 0.9 % IV BOLUS (SEPSIS)
500.0000 mL | Freq: Once | INTRAVENOUS | Status: AC
  Administered 2014-03-06: 500 mL via INTRAVENOUS

## 2014-03-06 MED ORDER — LORAZEPAM 2 MG/ML IJ SOLN
1.0000 mg | Freq: Once | INTRAMUSCULAR | Status: AC
  Administered 2014-03-06: 1 mg via INTRAVENOUS
  Filled 2014-03-06: qty 1

## 2014-03-06 MED ORDER — ONDANSETRON HCL 4 MG/2ML IJ SOLN: 4.0000 mg | Freq: Four times a day (QID) | INTRAMUSCULAR | Status: DC | PRN

## 2014-03-06 MED ORDER — PAROXETINE HCL 10 MG PO TABS
10.0000 mg | ORAL_TABLET | Freq: Every day | ORAL | Status: DC
  Administered 2014-03-07 – 2014-03-08 (×2): 10 mg via ORAL
  Filled 2014-03-06 (×2): qty 1

## 2014-03-06 MED ORDER — CLONAZEPAM 0.5 MG PO TABS
0.5000 mg | ORAL_TABLET | Freq: Three times a day (TID) | ORAL | Status: DC
  Administered 2014-03-07 – 2014-03-08 (×5): 0.5 mg via ORAL
  Filled 2014-03-06 (×5): qty 1

## 2014-03-06 MED ORDER — LEVOTHYROXINE SODIUM 100 MCG PO TABS
100.0000 ug | ORAL_TABLET | Freq: Every day | ORAL | Status: DC
  Administered 2014-03-07 – 2014-03-08 (×2): 100 ug via ORAL
  Filled 2014-03-06 (×3): qty 1

## 2014-03-06 MED ORDER — DEXTROSE 5 % IV SOLN
1.0000 g | INTRAVENOUS | Status: DC
  Administered 2014-03-07 (×2): 1 g via INTRAVENOUS
  Filled 2014-03-06 (×3): qty 10

## 2014-03-06 MED ORDER — CLONAZEPAM 0.5 MG PO TABS
1.0000 mg | ORAL_TABLET | Freq: Once | ORAL | Status: AC
  Administered 2014-03-06: 1 mg via ORAL
  Filled 2014-03-06: qty 2

## 2014-03-06 MED ORDER — SODIUM CHLORIDE 0.9 % IJ SOLN
3.0000 mL | Freq: Two times a day (BID) | INTRAMUSCULAR | Status: DC
  Administered 2014-03-07 – 2014-03-08 (×2): 3 mL via INTRAVENOUS

## 2014-03-06 MED ORDER — SODIUM CHLORIDE 0.9 % IV SOLN
INTRAVENOUS | Status: DC
  Administered 2014-03-06: 23:00:00 via INTRAVENOUS

## 2014-03-06 MED ORDER — ACETAMINOPHEN 650 MG RE SUPP: 650.0000 mg | Freq: Four times a day (QID) | RECTAL | Status: DC | PRN

## 2014-03-06 MED ORDER — HYDROMORPHONE HCL PF 1 MG/ML IJ SOLN: 0.5000 mg | INTRAMUSCULAR | Status: DC | PRN

## 2014-03-06 MED ORDER — ONDANSETRON HCL 4 MG PO TABS: 4.0000 mg | ORAL_TABLET | Freq: Four times a day (QID) | ORAL | Status: DC | PRN

## 2014-03-06 MED ORDER — LORAZEPAM 2 MG/ML IJ SOLN: 1.0000 mg | Freq: Four times a day (QID) | INTRAMUSCULAR | Status: DC | PRN

## 2014-03-06 MED ORDER — ACETAMINOPHEN 325 MG PO TABS: 650.0000 mg | ORAL_TABLET | Freq: Four times a day (QID) | ORAL | Status: DC | PRN

## 2014-03-06 MED ORDER — HYDROXYZINE HCL 50 MG PO TABS
100.0000 mg | ORAL_TABLET | Freq: Three times a day (TID) | ORAL | Status: DC | PRN
  Filled 2014-03-06: qty 2

## 2014-03-06 NOTE — ED Notes (Signed)
Md Amada JupiterKirkpatrick at bedside.

## 2014-03-06 NOTE — ED Notes (Addendum)
Pt given cup of ice water. No difficulty with swallowing noted.

## 2014-03-06 NOTE — ED Provider Notes (Signed)
CSN: [id number]     Arrival date & time 03/06/14  1518 History   First MD Initiated Contact with Patient 03/06/14 1529     Chief Complaint  Patient presents with  . Seizures     (Consider location/radiation/quality/duration/timing/severity/associated sxs/prior Treatment) The history is provided by a relative, the EMS personnel and the patient. The history is limited by the condition of the patient.   [name] is a 63 y.o. female with a history of asthma, depression, Genella Rife and concern for possible benzodiazepine withdrawal.  She has been on klonopin 1 mg 4 times daily until she ran out of this medicine on 7/21, 1 week ago.  She was seen by her pcp on the 21st at which time her urine sample showed "abnormal results" and therefore her clonazepam and her tylenol #4 was not refilled.  Her neighbors (per family at bedside) state that she has had episodes of confusion with slurred speech since yesterday associated with diaphoresis.  In her physicians office this morning she had a new onset tonic clonic seizure which lasted about a minute.  She was given valium 10 mg IV and presents here for further evaluation.  She is unable to give a meaningful history at this time as she is confused, appears post ictal.     Past Medical History  Diagnosis Date  . Asthma   . Depression   . GERD (gastroesophageal reflux disease)   . Thyroid disease   . Inflammatory polyps of colon     pt notes flat and is to have surgery to remove   . Osteoarthritis of both knees    Past Surgical History  Procedure Laterality Date  . Abdominal hysterectomy    . Cataract extraction, bilateral    . Shoulder surgery     Family History  Problem Relation Age of Onset  . Cancer Mother   . Cancer Brother   . Cancer Brother    History  Substance Use Topics  . Smoking status: Current Every Day Smoker -- 1.00 packs/day for 40 years    Types: Cigarettes  . Smokeless tobacco: Not on file  . Alcohol Use: No   OB  History   Grav Para Term Preterm Abortions TAB SAB Ect Mult Living                 Review of Systems  Unable to perform ROS Neurological: Positive for seizures and speech difficulty.  Psychiatric/Behavioral: Positive for confusion.      Allergies  Review of patient's allergies indicates no known allergies.  Home Medications   Prior to Admission medications   Medication Sig Start Date End Date Taking? Authorizing Provider  acetaminophen-codeine (TYLENOL #4) 300-60 MG per tablet Take 2 tablets by mouth 4 (four) times daily as needed for moderate pain.    Yes Historical Provider, MD  albuterol (PROVENTIL HFA;VENTOLIN HFA) 108 (90 BASE) MCG/ACT inhaler Inhale 2 puffs into the lungs every 4 (four) hours as needed for wheezing or shortness of breath.   Yes Historical Provider, MD  buPROPion (WELLBUTRIN SR) 200 MG 12 hr tablet Take 200 mg by mouth daily.   Yes Historical Provider, MD  hydrOXYzine (VISTARIL) 100 MG capsule Take 100 mg by mouth 3 (three) times daily as needed. For nerves   Yes Historical Provider, MD  levothyroxine (SYNTHROID, LEVOTHROID) 100 MCG tablet Take 100 mcg by mouth daily before breakfast.   Yes Historical Provider, MD  PARoxetine (PAXIL) 10 MG tablet Take 10 mg by mouth daily.  Yes Historical Provider, MD  QUEtiapine (SEROQUEL) 400 MG tablet Take 600 mg by mouth at bedtime.   Yes Historical Provider, MD   BP 134/93  Pulse 106  Temp(Src) 98.3 F (36.8 C) (Oral)  Resp 14  SpO2 98% Physical Exam  Nursing note and vitals reviewed. Constitutional: She appears well-developed and well-nourished.  HENT:  Head: Normocephalic and atraumatic.  Eyes: Conjunctivae and EOM are normal. Pupils are equal, round, and reactive to light.  Neck: Normal range of motion.  Cardiovascular: Normal rate, regular rhythm, normal heart sounds and intact distal pulses.   Pulmonary/Chest: Effort normal and breath sounds normal. She has no wheezes.  Abdominal: Soft. Bowel sounds are  normal. There is no tenderness.  Musculoskeletal: Normal range of motion.  Neurological: She is alert. She has normal strength. She is disoriented. No cranial nerve deficit or sensory deficit.  Skin: Skin is warm and dry.  Psychiatric: She has a normal mood and affect. Her speech is slurred. She is not agitated. Cognition and memory are impaired.  Speech is intermittently slurred,  At times speaks clearly but will use inappropriate words, not a consistently altered speech pattern.    ED Course  Procedures (including critical care time) Labs Review Labs Reviewed  CBC WITH DIFFERENTIAL - Abnormal; Notable for the following:    Neutrophils Relative % 81 (*)    Lymphocytes Relative 11 (*)    All other components within normal limits  URINALYSIS, ROUTINE W REFLEX MICROSCOPIC - Abnormal; Notable for the following:    APPearance TURBID (*)    Hgb urine dipstick TRACE (*)    Ketones, ur 40 (*)    Nitrite POSITIVE (*)    All other components within normal limits  URINE RAPID DRUG SCREEN (HOSP PERFORMED) - Abnormal; Notable for the following:    Benzodiazepines POSITIVE (*)    All other components within normal limits  URINE MICROSCOPIC-ADD ON - Abnormal; Notable for the following:    Squamous Epithelial / LPF MANY (*)    Bacteria, UA MANY (*)    All other components within normal limits  I-STAT CHEM 8, ED - Abnormal; Notable for the following:    Sodium 130 (*)    Potassium 3.5 (*)    Glucose, Bld 104 (*)    Calcium, Ion 1.12 (*)    Hemoglobin 15.3 (*)    All other components within normal limits  URINE CULTURE    Imaging Review Ct Head Wo Contrast  03/06/2014   CLINICAL DATA:  Altered mental status, slurred speech, tonic-clonic seizure.  EXAM: CT HEAD WITHOUT CONTRAST  TECHNIQUE: Contiguous axial images were obtained from the base of the skull through the vertex without intravenous contrast.  COMPARISON:  None.  FINDINGS: Mild periventricular and subcortical white matter  hypodensities, a nonspecific finding often seen in the setting of chronic microangiopathic change. The ventricles, cisterns, sulci are normal in size, shape, and position for age. No intraparenchymal hemorrhage, mass, mass effect, or abnormal extra-axial fluid collection. Partially opacified left ethmoid air cells. Mild left maxillary sinus mucosal thickening. Mastoid air cells are predominantly clear. Atherosclerotic vascular calcifications. Lens resections.  IMPRESSION: Mild white matter changes as above. No definite CT evidence of acute intracranial abnormality. MRI recommended if concern for acute ischemia persists.  Partially opacified left ethmoid air cells. Correlate clinically for sinusitis.   Electronically Signed   By: Jearld LeschAndrew  DelGaizo M.D.   On: 03/06/2014 17:06     EKG Interpretation   Date/Time:  Tuesday March 06 2014 15:29:49  EDT Ventricular Rate:  115 PR Interval:  143 QRS Duration: 73 QT Interval:  365 QTC Calculation: 505 R Axis:   75 Text Interpretation:  Age not entered, assumed to be  63 years old for  purpose of ECG interpretation Sinus tachycardia Atrial premature complex  Prolonged QT interval Confirmed by Blinda Leatherwood  MD, CHRISTOPHER [phone]) on  03/06/2014 6:28:57 PM      MDM   Final diagnoses:  Benzodiazepine withdrawal with delirium    Discussed case with Dr. Amada Jupiter with neurology.  Will consult patient, asks for medical admission.  Recommended clonazepam continuance at this time. Dr. Lovell Sheehan with [facility] accepts pt.  Temp admission orders placed.    Burgess Amor, PA-C 03/06/14 2124

## 2014-03-06 NOTE — Consult Note (Signed)
Neurology Consultation Reason for Consult: Seizures, confusion Referring Physician: Pollina, C  CC: Seizures  History is obtained from: Patient, medical record  HPI: [name] is a 63 y.o. female with a history of anxiety who was being treated with clonazepam 1mg  QID. She failed her UDS(positive for morphine?) and was under a contract and therefore was not prescribed any further controlled substances. She therefore stopped her clonazepam abruptly 1 week ago. Yesterday, she had increasing feelings of anxiety and began to display some confusion. She went to her PCP for these symptoms today where she had a GTC. She was given IV valium 10mg  x 1 and [facility]    LKW: 7/27 tpa given?: no, outside of window.     ROS: A 14 point ROS was performed and is negative except as noted in the HPI.   Past Medical History  Diagnosis Date  . Asthma   . Depression   . GERD (gastroesophageal reflux disease)   . Thyroid disease   . Inflammatory polyps of colon     pt notes flat and is to have surgery to remove   . Osteoarthritis of both knees     Family History: No hx similar  Social History: Tob: current smoker.   Exam: Current vital signs: BP 137/87  Pulse 96  Temp(Src) 98.3 F (36.8 C) (Oral)  Resp 17  SpO2 97% Vital signs in last 24 hours: Temp:  [98.3 F (36.8 C)] 98.3 F (36.8 C) (07/28 1529) Pulse Rate:  [95-109] 96 (07/28 2000) Resp:  [14-21] 17 (07/28 2000) BP: (127-140)/(85-99) 137/87 mmHg (07/28 2000) SpO2:  [94 %-99 %] 97 % (07/28 2000)  General: in bed, appears anxious  CV: RRR Mental Status: Patient is awake, alert, oriented to person, but not able to give year or place. She makes paraphasic and word substitutions.  No signs of neglect. Cranial Nerves: II: Visual Fields are full. Pupils are equal, round, and reactive to light.  Discs are difficult to visualize. III,IV, VI: EOMI without ptosis or diploplia.  V: Facial sensation is symmetric to  temperature VII: Facial movement is symmetric.  VIII: hearing is intact to voice X: Uvula elevates symmetrically XI: Shoulder shrug is symmetric. XII: tongue is midline without atrophy or fasciculations.  Motor: Tone is normal. Bulk is normal. 5/5 strength was present in all four extremities.  Sensory: Sensation is symmetric to light touch and temperature in the arms and legs. Deep Tendon Reflexes: 2+ and symmetric in the biceps and patellae.  Cerebellar: FNF intact bilaterally Gait: Not assessed due to  multiple medical monitors in ED setting.    I have reviewed labs in epic and the results pertinent to this consultation are: UDS - positive for benzos as expected given pre-hospital administration  I have reviewed the images obtained:ct head - no acute findings  Impression: 63 yo F with confusion and seizure one week following cessation of benzodiazepines. Given the correlation in time, I strongly suspect this represents benzodiazepine withdrawal. She does have a speech pattern concerning for aphasia, and therefore we'll also get an MRI and EEG.  Recommendations: 1) MRI brain 2) EEG 3) I would restart her on benzodiazepines to plan on a slow wean given her current symptoms. 4) neurology will continue to follow   Ritta SlotMcNeill Keyerra Lamere, MD [facility] [phone]  If 7pm- 7am, please page neurology on call as listed in AMION.

## 2014-03-06 NOTE — H&P (Addendum)
[facility] Admission History and Physical       [name]:[id number] DOB: 11-27-1950 DOA: 03/06/2014  Referring physician:  EDP PCP:  Dr Clarene Duke Specialists:   Chief Complaint:  Seizure  HPI: [name] is a 63 y.o. female who was seen in the office of her PC Dr Clarene Duke due to increased confusion  and speaking an not making any sense over the past 24 hours.   She was taken to see her PCP today and was evaluated and suffered a witnessed tonic clonic seizure in the office and was sent to the ED.  She was administered IV Valium 10 mg x1.   She had been on Clonazepam 0.5 mg QID for several years which was discontinued 1 week ago when a UDS that was performed by her PCP returned positive for morphine.  Her prescription for further Clonazepam was discontinued at that time.     A Ct Scan of the had was performed in the ED  Which revealed no acute findings but a partially opacified Left Ethmoid Air Cells Sinus.  She was seen in the ED by Neurology Dr Amada Jupiter and referred for admission to monitor for further seizure activity from Benzodiazepine withdrawal.         Review of Systems:  Constitutional: No Weight Loss, No Weight Gain, Night Sweats, Fevers, Chills, Dizziness, Fatigue, or Generalized Weakness HEENT: No Headaches, Difficulty Swallowing,Tooth/Dental Problems,Sore Throat,  No Sneezing, Rhinitis, Ear Ache, Nasal Congestion, or Post Nasal Drip,  Cardio-vascular:  No Chest pain, Orthopnea, PND, Edema in Lower Extremities, Anasarca, Dizziness, Palpitations  Resp: No Dyspnea, No DOE, No Productive Cough, No Non-Productive Cough, No Hemoptysis, No Wheezing.    GI: No Heartburn, Indigestion, Abdominal Pain, Nausea, Vomiting, Diarrhea, Hematemesis, Hematochezia, Melena, Change in Bowel Habits,  Loss of Appetite  GU: No Dysuria, Change in Color of Urine, No Urgency or Frequency, No Flank pain.  Musculoskeletal: No Joint Pain or Swelling, No Decreased Range of Motion,  No Back Pain.  Neurologic: No Syncope, No Seizures, Muscle Weakness, Paresthesia, Vision Disturbance or Loss, No Diplopia, No Vertigo, No Difficulty Walking,  Skin: No Rash or Lesions. Psych: No Change in Mood or Affect, No Depression or Anxiety, No Memory loss, No Confusion, or Hallucinations   Past Medical History  Diagnosis Date  . Asthma   . Depression   . GERD (gastroesophageal reflux disease)   . Thyroid disease   . Inflammatory polyps of colon     pt notes flat and is to have surgery to remove   . Osteoarthritis of both knees     Past Surgical History  Procedure Laterality Date  . Abdominal hysterectomy    . Cataract extraction, bilateral    . Shoulder surgery       Prior to Admission medications   Medication Sig Start Date End Date Taking? Authorizing Provider  acetaminophen-codeine (TYLENOL #4) 300-60 MG per tablet Take 2 tablets by mouth 4 (four) times daily as needed for moderate pain.    Yes Historical Provider, MD  albuterol (PROVENTIL HFA;VENTOLIN HFA) 108 (90 BASE) MCG/ACT inhaler Inhale 2 puffs into the lungs every 4 (four) hours as needed for wheezing or shortness of breath.   Yes Historical Provider, MD  buPROPion (WELLBUTRIN SR) 200 MG 12 hr tablet Take 200 mg by mouth daily.   Yes Historical Provider, MD  hydrOXYzine (VISTARIL) 100 MG capsule Take 100 mg by mouth 3 (three) times daily as needed. For nerves   Yes Historical Provider, MD  levothyroxine (SYNTHROID, LEVOTHROID) 100 MCG tablet Take 100 mcg by mouth daily before breakfast.   Yes Historical Provider, MD  PARoxetine (PAXIL) 10 MG tablet Take 10 mg by mouth daily.   Yes Historical Provider, MD  QUEtiapine (SEROQUEL) 400 MG tablet Take 600 mg by mouth at bedtime.   Yes Historical Provider, MD    No Known Allergies   Social History:  reports that she has been smoking Cigarettes.  She has a 40 pack-year smoking history. She does not have any smokeless tobacco history on file. She reports that she does  not drink alcohol or use illicit drugs.     Family History  Problem Relation Age of Onset  . Cancer Mother   . Cancer Brother   . Cancer Brother        Physical Exam:  GEN:  Pleasant Mildly confused  63 y.o. Caucasian female examined and in no acute distress; cooperative with exam Filed Vitals:   03/06/14 1900 03/06/14 2000 03/06/14 2100 03/06/14 2200  BP: 138/99 137/87 134/93 138/89  Pulse: 103 96 106 94  Temp:      TempSrc:      Resp: 14 17 14 20   SpO2: 99% 97% 98% 96%   Blood pressure 138/89, pulse 94, temperature 98.3 F (36.8 C), temperature source Oral, resp. rate 20, SpO2 96.00%. PSYCH: She is alert and oriented x4; does not appear anxious does not appear depressed; affect is normal HEENT: Normocephalic and Atraumatic, Mucous membranes pink; PERRLA; EOM intact; Fundi:  Benign;  No scleral icterus, Nares: Patent, Oropharynx: Clear, Fair Dentition, Neck:  FROM, No Cervical Lymphadenopathy nor Thyromegaly or Carotid Bruit; No JVD; Breasts:: Not examined CHEST WALL: No tenderness CHEST: Normal respiration, clear to auscultation bilaterally HEART: Regular rate and rhythm; no murmurs rubs or gallops BACK: No kyphosis or scoliosis; No CVA tenderness ABDOMEN: Positive Bowel Sounds, Soft Non-Tender; No Masses, No Organomegaly. Rectal Exam: Not done EXTREMITIES: No Cyanosis, Clubbing, or Edema; No Ulcerations. Genitalia: not examined PULSES: 2+ and symmetric SKIN: Normal hydration no rash or ulceration CNS:  Alert and Oriented X 3,   No Foal Deficits Vascular: pulses palpable throughout    Labs on Admission:  Basic Metabolic Panel:  Recent Labs Lab 03/06/14 1625  NA 130*  K 3.5*  CL 96  GLUCOSE 104*  BUN 9  CREATININE 0.90   Liver Function Tests: No results found for this basename: AST, ALT, ALKPHOS, BILITOT, PROT, ALBUMIN,  in the last 168 hours No results found for this basename: LIPASE, AMYLASE,  in the last 168 hours No results found for this basename:  AMMONIA,  in the last 168 hours CBC:  Recent Labs Lab 03/06/14 1552 03/06/14 1625  WBC 7.7  --   NEUTROABS 6.3  --   HGB 13.9 15.3*  HCT 40.0 45.0  MCV 97.3  --   PLT 247  --    Cardiac Enzymes: No results found for this basename: CKTOTAL, CKMB, CKMBINDEX, TROPONINI,  in the last 168 hours  BNP (last 3 results) No results found for this basename: PROBNP,  in the last 8760 hours CBG: No results found for this basename: GLUCAP,  in the last 168 hours  Radiological Exams on Admission: Ct Head Wo Contrast  03/06/2014   CLINICAL DATA:  Altered mental status, slurred speech, tonic-clonic seizure.  EXAM: CT HEAD WITHOUT CONTRAST  TECHNIQUE: Contiguous axial images were obtained from the base of the skull through the vertex without intravenous contrast.  COMPARISON:  None.  FINDINGS: Mild  periventricular and subcortical white matter hypodensities, a nonspecific finding often seen in the setting of chronic microangiopathic change. The ventricles, cisterns, sulci are normal in size, shape, and position for age. No intraparenchymal hemorrhage, mass, mass effect, or abnormal extra-axial fluid collection. Partially opacified left ethmoid air cells. Mild left maxillary sinus mucosal thickening. Mastoid air cells are predominantly clear. Atherosclerotic vascular calcifications. Lens resections.  IMPRESSION: Mild white matter changes as above. No definite CT evidence of acute intracranial abnormality. MRI recommended if concern for acute ischemia persists.  Partially opacified left ethmoid air cells. Correlate clinically for sinusitis.   Electronically Signed   By: Jearld LeschAndrew  DelGaizo M.D.   On: 03/06/2014 17:06     EKG: Independently reviewed.    Assessment/Plan:   63 y.o. female with  Principal Problem:   1.   Benzodiazepine withdrawal with delirium   Restart Benzodiazepine Rx, and begin to wean dose     Placed on TID Clonazepam to start in AM.  PRN IV Ativan,    and has Vistaril for  Anxiety   Active Problems:   2.   Seizure- Due to #1.      Placed on Seizure Precautions and PRN IV Ativan      3.   Urinary tract infection, site not specified   Urine Culture ordered, and placed on IV Rocephin.          4.   Bipolar 1 disorder, depressed, moderate   Continue Paxil Rx, and  Buproprion Rx.       5.   Hypothyroid   Check TSH and T4 levels.        6.   Tobacco use   Nicotine Patch daily.        7.   DVT Prophylaxis   SCDs      Code Status:   FULL CODE Family Communication:   No Family Present  Disposition Plan:   Inpatient   Time spent:  5960 Minutes  Ron ParkerJENKINS,Elsa Ploch C [facility] Pager [phone]   If 7AM -7PM Please Contact the Day Rounding Team MD for [facility]  If 7PM-7AM, Please Contact night-coverage  [email or web address] Password [email or web address] 03/06/2014, 10:58 PM

## 2014-03-06 NOTE — ED Notes (Signed)
Per EMS: Pt from PCP for altered mental status. Per neighbors, pt hasn't been acting correctly since yesterday with slurred speech and intermittent diaphoresis. Pt reports that she hasn't been taking pain medications that she takes for chronic pain. While at office, pt had tonic clonic seizures lasting 1 minute. Pt given 10 mg  Valium IV. Pt now alert, but noted to be confused with word salad. Neuro intact, grips equal. No hx of seizures.

## 2014-03-07 ENCOUNTER — Inpatient Hospital Stay (HOSPITAL_COMMUNITY): Payer: PRIVATE HEALTH INSURANCE

## 2014-03-07 ENCOUNTER — Encounter (HOSPITAL_COMMUNITY): Payer: Self-pay | Admitting: General Practice

## 2014-03-07 DIAGNOSIS — E039 Hypothyroidism, unspecified: Secondary | ICD-10-CM

## 2014-03-07 LAB — CBC
HEMATOCRIT: 39.5 % (ref 36.0–46.0)
Hemoglobin: 13.2 g/dL (ref 12.0–15.0)
MCH: 33.8 pg (ref 26.0–34.0)
MCHC: 33.4 g/dL (ref 30.0–36.0)
MCV: 101.3 fL — AB (ref 78.0–100.0)
Platelets: 212 10*3/uL (ref 150–400)
RBC: 3.9 MIL/uL (ref 3.87–5.11)
RDW: 13.6 % (ref 11.5–15.5)
WBC: 5.4 10*3/uL (ref 4.0–10.5)

## 2014-03-07 LAB — BASIC METABOLIC PANEL
Anion gap: 20 — ABNORMAL HIGH (ref 5–15)
BUN: 6 mg/dL (ref 6–23)
CO2: 18 meq/L — AB (ref 19–32)
Calcium: 8.3 mg/dL — ABNORMAL LOW (ref 8.4–10.5)
Chloride: 104 mEq/L (ref 96–112)
Creatinine, Ser: 0.75 mg/dL (ref 0.50–1.10)
GFR calc non Af Amer: 89 mL/min — ABNORMAL LOW (ref >90)
Glucose, Bld: 80 mg/dL (ref 70–99)
Potassium: 3.2 mEq/L — ABNORMAL LOW (ref 3.7–5.3)
Sodium: 142 mEq/L (ref 137–147)

## 2014-03-07 MED ORDER — ENOXAPARIN SODIUM 40 MG/0.4ML ~~LOC~~ SOLN
40.0000 mg | SUBCUTANEOUS | Status: DC
  Administered 2014-03-07 – 2014-03-08 (×2): 40 mg via SUBCUTANEOUS
  Filled 2014-03-07 (×2): qty 0.4

## 2014-03-07 MED ORDER — POTASSIUM CHLORIDE CRYS ER 20 MEQ PO TBCR
40.0000 meq | EXTENDED_RELEASE_TABLET | Freq: Once | ORAL | Status: AC
  Administered 2014-03-07: 40 meq via ORAL
  Filled 2014-03-07: qty 2

## 2014-03-07 NOTE — ED Provider Notes (Signed)
Medical screening examination/treatment/procedure(s) were conducted as a shared visit with non-physician practitioner(s) and myself.  I personally evaluated the patient during the encounter.   EKG Interpretation   Date/Time:  Tuesday March 06 2014 15:29:49 EDT Ventricular Rate:  115 PR Interval:  143 QRS Duration: 73 QT Interval:  365 QTC Calculation: 505 R Axis:   75 Text Interpretation:  Age not entered, assumed to be  63 years old for  purpose of ECG interpretation Sinus tachycardia Atrial premature complex  Prolonged QT interval Confirmed by POLLINA  MD, CHRISTOPHER [phone]([id number]) on  03/06/2014 6:28:57 PM      Patient had a witnessed seizure prior to arrival. Patient does have a history of vomiting use, likely a withdrawal seizure today. She continues, however, to have confusion and intermittent expressive aphasia. This is not explained by a postictal state, as it is present more than 5 hours after the initial seizure. Initial workup did not show any evidence of stroke or other intracranial abnormality. Patient to be admitted by medicine for further management.  Gilda Creasehristopher J. Pollina, MD 03/07/14 1750

## 2014-03-07 NOTE — Progress Notes (Signed)
Utilization review completed.  

## 2014-03-07 NOTE — Care Management Note (Signed)
    Page 1 of 1   03/08/2014     10:50:30 AM CARE MANAGEMENT NOTE 03/08/2014  Patient:  [name],[name]   Account Number:  [id number]  Date Initiated:  03/07/2014  Documentation initiated by:  Letha CapeAYLOR,Darleny Sem  Subjective/Objective Assessment:   dx seizures, depression  admit- lives alone.     Action/Plan:   pt eval- rec snf   Anticipated DC Date:  03/08/2014   Anticipated DC Plan:  SKILLED NURSING FACILITY  In-house referral  Clinical Social Worker      DC Planning Services  CM consult      Choice offered to / List presented to:             Status of service:  Completed, signed off Medicare Important Message given?  NA - LOS <3 / Initial given by admissions (If response is "NO", the following Medicare IM given date fields will be blank) Date Medicare IM given:   Medicare IM given by:   Date Additional Medicare IM given:   Additional Medicare IM given by:    Discharge Disposition:  SKILLED NURSING FACILITY  Per UR Regulation:  Reviewed for med. necessity/level of care/duration of stay  If discussed at Long Length of Stay Meetings, dates discussed:    Comments:  03/08/14 1049 Letha Capeeborah Audyn Dimercurio RN, BSN [phone] physical therapy rec snf, CSW facilitating placement.

## 2014-03-07 NOTE — Progress Notes (Signed)
Pt admitted to 5w16 from ED. Pt is A&Ox2. Pt lives at home with family. Pt has bruising to bilateral arms. Pt placed on telemetry. Pt and pt's sister oriented to room and unit. Set up suction and O2 in room to provide seizure precautions. Will continue to monitor pt. Nelda MarseilleJenny Thacker, RN

## 2014-03-07 NOTE — Evaluation (Signed)
Physical Therapy Evaluation Patient Details Name: Brittany SloughCarolyn S [name] MRN: [id number] DOB: October 25, 1950 Today's Date: 03/07/2014   History of Present Illness  Patient is a 63 y/o female admitted with increased confusion and not making any sense over the past 24 hours.  Pt was taken to see PCP 7/28 and suffered a witnessed tonic clonic seizure in the office and was sent to the ED. Admitted to monitor further seizure activity secondary to benzodiapine withdrawal.    Clinical Impression  Patient presents with balance and cognitive deficits - impaired safety awareness/judgment and distractibility- putting pt at increased risk for falls. Pt easily distracted and unsteady during ambulation. Pt not able to safely care for self at home at this time. Difficult to ascertain actual cognitive baseline as pt's cognitive status fluctuates frequently even within session (and during day per RN). Pt would benefit from skilled PT to maximize independence and improve safe mobility prior to discharge home as pt high falls risk. If going home, will need supervision for safety. Recommend speech consult for cognitive evaluation.   Follow Up Recommendations SNF;Supervision - Intermittent    Equipment Recommendations  None recommended by PT    Recommendations for Other Services       Precautions / Restrictions Precautions Precautions: Fall Precaution Comments: Seizure precautions. Restrictions Weight Bearing Restrictions: No      Mobility  Bed Mobility Overal bed mobility: Modified Independent                Transfers Overall transfer level: Modified independent Equipment used: None             General transfer comment: Stood from EOB x3 without AD.  Ambulation/Gait Ambulation/Gait assistance: Min guard Ambulation Distance (Feet): 250 Feet (+100 with RW with 1 seated rest break.) Assistive device: Rolling walker (2 wheeled);None Gait Pattern/deviations: Step-through pattern;Staggering  left;Staggering right;Drifts right/left   Gait velocity interpretation: Below normal speed for age/gender General Gait Details: Pt staggering R/L during ambulation with unsteadiness noted and some scrissoring at times. A few LOB when distracted or with head turns. Lack of arm swing as pt with arms crossed across chest. Balance worsened with use of RW for support.  Stairs Stairs: Yes Stairs assistance: Min guard Stair Management: Alternating pattern;One rail Left;One rail Right Number of Stairs: 12 General stair comments: Ascended/descended steps slowly.  Wheelchair Mobility    Modified Rankin (Stroke Patients Only)       Balance Overall balance assessment: Needs assistance   Sitting balance-Leahy Scale: Normal       Standing balance-Leahy Scale: Fair Standing balance comment: Unsteady.                             Pertinent Vitals/Pain Reports no pain. HR increased to 125 bpm post gait training and disagreement with family members.     Home Living Family/patient expects to be discharged to:: Private residence Living Arrangements: Alone Available Help at Discharge: Friend(s);Available PRN/intermittently Type of Home: House Home Access: Stairs to enter Entrance Stairs-Rails: Right Entrance Stairs-Number of Steps: 8 Home Layout: One level Home Equipment: None      Prior Function Level of Independence: Independent         Comments: [place] not drive, [place] get to doctor's appts; reports multiple falls at home daily. Family reports pt walks across highway to get to corner store to buy a soda or snacks. "Sometimes I do not eat," when asked about cooking.  Does not appear to have support/assist from family.     Hand Dominance        Extremity/Trunk Assessment   Upper Extremity Assessment: Overall WFL for tasks assessed           Lower Extremity Assessment: Overall WFL for tasks assessed          Communication   Communication: No difficulties  Cognition Arousal/Alertness: Awake/alert Behavior During Therapy: Agitated (Upon entering room, pt and sister arguing and yelling about pt wanting to see pill bottle from home. Pt climbing out of bed with bed alarm going off. ) Overall Cognitive Status: Impaired/Different from baseline Area of Impairment: Orientation;Memory;Attention;Safety/judgement;Awareness;Following commands Orientation Level: Disoriented to;Time;Situation (Reports the date being, "March...June" "1515" or "2015") Current Attention Level: Alternating;Divided (Difficulty staying on task and topic of conversation. ) Memory: Decreased short-term memory Following Commands: Follows multi-step commands inconsistently (Requires constant verbal cues and redirection.) Safety/Judgement: Decreased awareness of safety     General Comments: Pt with impaired safety awareness/judgment, easily distracted during mobility. Not able to recall conversations within session.     General Comments General comments (skin integrity, edema, etc.): Swelling present right knee secondary to arthritis per pt report.    Exercises        Assessment/Plan    PT Assessment Patient needs continued PT services  PT Diagnosis Difficulty walking   PT Problem List Decreased cognition;Decreased balance;Decreased safety awareness;Decreased knowledge of precautions  PT Treatment Interventions Gait training;Balance training;Stair training;Cognitive remediation;Patient/family education;Therapeutic activities;Functional mobility training   PT Goals (Current goals can be found in the Care Plan section) Acute Rehab PT Goals Patient Stated Goal: to go feed my cats. PT Goal Formulation: With patient Time For Goal Achievement: 03/21/14 Potential to Achieve Goals: Fair    Frequency Min 3X/week   Barriers to discharge Decreased caregiver support      Co-evaluation               End of Session  Equipment Utilized During Treatment: Gait belt Activity Tolerance: Patient tolerated treatment well Patient left: in bed;with bed alarm set;with call bell/phone within reach;with family/visitor present Nurse Communication: Mobility status         Time: 4098-1191 PT Time Calculation (min): 28 min   Charges:   PT Evaluation $Initial PT Evaluation Tier I: 1 Procedure PT Treatments $Therapeutic Activity: 8-22 mins   PT G CodesAlvie Heidelberg A 03/07/2014, 3:08 PM Alvie Heidelberg, PT, DPT [phone]

## 2014-03-07 NOTE — Procedures (Signed)
ELECTROENCEPHALOGRAM REPORT   Patient: [name]       Room #: 1O105W16 EEG No. ID: [id number] Age: 63 y.o.        Sex: female Referring Physician: Ghimire Report Date:  03/07/2014        Interpreting Physician: Thana FarrEYNOLDS,Manjot Hinks D  History: [name] is an 63 y.o. female with seizure in the setting of benzodiazepine withdrawal  Medications:  Scheduled: . buPROPion  200 mg Oral Daily  . cefTRIAXone (ROCEPHIN)  IV  1 g Intravenous Q24H  . clonazePAM  0.5 mg Oral TID  . enoxaparin (LOVENOX) injection  40 mg Subcutaneous Q24H  . levothyroxine  100 mcg Oral QAC breakfast  . PARoxetine  10 mg Oral Daily  . QUEtiapine  600 mg Oral QHS  . sodium chloride  3 mL Intravenous Q12H    Conditions of Recording:  This is a 16 channel EEG carried out with the patient in the awake and drowsy states.  Description:  The waking background activity consists of a low voltage, symmetrical, fairly well organized, 8 Hz alpha activity, seen from the parieto-occipital and posterior temporal regions.  Low voltage fast activity, poorly organized, is seen anteriorly and is at times superimposed on more posterior regions.  A mixture of theta and alpha rhythms are seen from the central and temporal regions. The patient drowses with slowing to irregular, low voltage theta and beta activity.   Stage II sleep is not obtained. Hyperventilation was not performed.   Intermittent photic stimulation was performed but failed to illicit any change in the tracing.    IMPRESSION: Normal electroencephalogram, awake, drowsy and with activation procedures. There are no focal lateralizing or epileptiform features.   Thana FarrLeslie Verba Ainley, MD [facility] [phone] 03/07/2014, 6:05 PM

## 2014-03-07 NOTE — Progress Notes (Signed)
EEG Completed; Results Pending  

## 2014-03-07 NOTE — Clinical Social Work Psychosocial (Signed)
Clinical Social Work Department BRIEF PSYCHOSOCIAL ASSESSMENT 03/07/2014  Patient:  [name], [name]     Account Number:  [id number]     Admit date:  03/06/2014  Clinical Social Worker:  Lovey Newcomer  Date/Time:  03/07/2014 03:57 PM  Referred by:  Physician  Date Referred:  03/07/2014 Referred for  SNF Placement   Other Referral:   Interview type:  Patient Other interview type:   Patient alert and oriented at time of assessment.    PSYCHOSOCIAL DATA Living Status:  ALONE Admitted from facility:   Level of care:   Primary support name:  Doren Custard and Anne Ng Primary support relationship to patient:  SIBLING Degree of support available:   Support is fair.    CURRENT CONCERNS Current Concerns  Post-Acute Placement   Other Concerns:    SOCIAL WORK ASSESSMENT / PLAN CSW met with patient and sisters at bedside to complete assessment. Patient states that she lives at home alone with 3 cats. Patient's family voices concerns about patient's living environment. Family states that the living environment is cluttered and dirty. Patient states that she will be agreeable to a SNF search, but is undecided about placement at this time. CSW explained that patient will need to make a decision by time of discharge. Patient seems saddened by the idea of going to SNF as she doesn't want to give up her three cats. Patient states that she does not know if this will be a long term SNF stay or just a short term stay.   Assessment/plan status:  Psychosocial Support/Ongoing Assessment of Needs Other assessment/ plan:   Complete Fl2, Fax, PASRR   Information/referral to community resources:   CSW contact information and SNF list given. ALF list also given.    PATIENT'S/FAMILY'S RESPONSE TO PLAN OF CARE: Patient agreeable to SNF search. CSW will follow up with bed offers.       Liz Beach MSW, [place], [place], [phone]

## 2014-03-07 NOTE — Discharge Summary (Signed)
PATIENT DETAILS Name: [name] Age: 63 y.o. Sex: female Date of Birth: 1951/04/28 MRN: [id number]. Admit Date: 03/06/2014 Admitting Physician: Ron Parker, MD WUJ:WJXBJY,NWGNF, MD  Recommendations for Outpatient Follow-up:  1. Please slowly taper of Klonopin-avoid abrupt discontinuation 2. Discharged with Keflex 500 mg for UTI.  Please follow for resolution of symptoms.  May need repeat urinalysis. 3. Follow up with PCP with continuation of pain and benzodiazepine contract that she has with her PCP 4. Will need oupatient Pscyh follow up for minimizing polypharmacy and adjustment of her medications. 5. Preliminary urine culture positive for gram-negative rods, please follow final sensitivity. Empirically on Keflex  PRIMARY DISCHARGE DIAGNOSIS:  Principal Problem:   Benzodiazepine withdrawal with delirium Active Problems:   Bipolar 1 disorder, depressed, moderate   Hypothyroid   Tobacco use   Seizure   Urinary tract infection, site not specified      PAST MEDICAL HISTORY: Past Medical History  Diagnosis Date  . Asthma   . Depression   . GERD (gastroesophageal reflux disease)   . Inflammatory polyps of colon     pt notes flat and is to have surgery to remove   . Osteoarthritis of both knees   . Hypothyroidism   . H/O hiatal hernia   . Seizure 03/06/2014    tonic clonic seizure which lasted about a minute  . Anxiety   . Bipolar disorder   . Benzodiazepine withdrawal with delirium 03/06/2014    DISCHARGE MEDICATIONS:   Medication List         acetaminophen-codeine 300-60 MG per tablet  Commonly known as:  TYLENOL #4  Take 2 tablets by mouth 4 (four) times daily as needed for moderate pain.     albuterol 108 (90 BASE) MCG/ACT inhaler  Commonly known as:  PROVENTIL HFA;VENTOLIN HFA  Inhale 2 puffs into the lungs every 4 (four) hours as needed for wheezing or shortness of breath.     buPROPion 200 MG 12 hr tablet  Commonly known as:  WELLBUTRIN SR    Take 200 mg by mouth daily.     cephALEXin 500 MG capsule  Commonly known as:  KEFLEX  Take 1 capsule (500 mg total) by mouth 4 (four) times daily. 3 more days from 03/08/14     clonazePAM 0.5 MG tablet  Commonly known as:  KLONOPIN  Take 1 tablet (0.5 mg total) by mouth 3 (three) times daily.     hydrOXYzine 100 MG capsule  Commonly known as:  VISTARIL  Take 100 mg by mouth 3 (three) times daily as needed. For nerves     levothyroxine 100 MCG tablet  Commonly known as:  SYNTHROID, LEVOTHROID  Take 100 mcg by mouth daily before breakfast.     PARoxetine 10 MG tablet  Commonly known as:  PAXIL  Take 10 mg by mouth daily.     QUEtiapine 400 MG tablet  Commonly known as:  SEROQUEL  Take 600 mg by mouth at bedtime.        ALLERGIES:  No Known Allergies  BRIEF HPI:  See H&P, Labs, Consult and Test reports for all details.  In brief, patient with hx of asthma, GERD, depression, bipolar 1 disorder, on 07/28 started having anxiety, increased confusion, and speech that was not making any sense.  She was taken to see her PCP to be evaluated and suffered a witnessed tonic clonic seizure in the office and was sent to the ED for further evaluation. She was administered IV Valium 10  mg x1 in the ED. She had been on Clonazepam 0.5 mg for several years which was discontinued 1 week ago when a UDS that was performed by her PCP returned positive for morphine.  She was under a contract and her prescription for further Clonazepam was discontinued at that time. Patient continued to have confusion and intermittent expressive aphasia that was not explained by postictal state as it presented more than 5 hours after initial seizure.  Patient was admitted by medicine for further management.   CONSULTATIONS:   neurology  PERTINENT RADIOLOGIC STUDIES: Ct Head Wo Contrast  03/06/2014   CLINICAL DATA:  Altered mental status, slurred speech, tonic-clonic seizure.  EXAM: CT HEAD WITHOUT CONTRAST   TECHNIQUE: Contiguous axial images were obtained from the base of the skull through the vertex without intravenous contrast.  COMPARISON:  None.  FINDINGS: Mild periventricular and subcortical white matter hypodensities, a nonspecific finding often seen in the setting of chronic microangiopathic change. The ventricles, cisterns, sulci are normal in size, shape, and position for age. No intraparenchymal hemorrhage, mass, mass effect, or abnormal extra-axial fluid collection. Partially opacified left ethmoid air cells. Mild left maxillary sinus mucosal thickening. Mastoid air cells are predominantly clear. Atherosclerotic vascular calcifications. Lens resections.  IMPRESSION: Mild white matter changes as above. No definite CT evidence of acute intracranial abnormality. MRI recommended if concern for acute ischemia persists.  Partially opacified left ethmoid air cells. Correlate clinically for sinusitis.   Electronically Signed   By: Jearld Lesch M.D.   On: 03/06/2014 17:06     PERTINENT LAB RESULTS: CBC:  Recent Labs  03/06/14 1552 03/06/14 1625 03/07/14 0601  WBC 7.7  --  5.4  HGB 13.9 15.3* 13.2  HCT 40.0 45.0 39.5  PLT 247  --  212   CMET CMP     Component Value Date/Time   NA 142 03/07/2014 0601   K 3.2* 03/07/2014 0601   CL 104 03/07/2014 0601   CO2 18* 03/07/2014 0601   GLUCOSE 80 03/07/2014 0601   GLUCOSE 144* 06/08/2006 1244   BUN 6 03/07/2014 0601   CREATININE 0.75 03/07/2014 0601   CALCIUM 8.3* 03/07/2014 0601   PROT 6.9 06/08/2006 1244   ALBUMIN 3.7 06/08/2006 1244   AST 26 06/08/2006 1244   ALT 25 06/08/2006 1244   ALKPHOS 129* 06/08/2006 1244   BILITOT 0.6 06/08/2006 1244   GFRNONAA 89* 03/07/2014 0601   GFRAA >90 03/07/2014 0601    BRIEF HOSPITAL COURSE:  Principal Problem:  1. Benzodiazepine withdrawal with delirium  -Secondary to cessation of benzodiazepines one week prior to admission.  USD was positive for Benzos and ct-head showed no acute findings. Patient was  admitted,-was restarted on Clonazepam tablet 1 mg once on 07/28 and dose weaned to Clonazepam 0.5 on 07/29. See below for further details -Discharged patient to SNF as patient lives alone and family does not think she will be able to take care of herself. Patient agreeable as well  Active Problems:  2. Seizure- Due to #1.  -secondary to above -was placed on Seizure Precautions and PRN IV Ativan -neuro consulted and EEG done 7/29, results showed a normal electroencephalogram, MRI of the brain was attempted, however patient refused due to persistent claustrophobia, discussed with neurology, at this time we do not feel that getting an MRI is imperative, CT of the head was negative for acute abnormalities on admission. EEG was negative for epileptiform discharges. Seizure most likely secondary to benzodiazepine withdrawal, avoid abrupt with continuation of benzodiazepine  in the future, slowly taper off Klonopin.  3. Encephalopathy  -multifactorial-benzo withdrawal, polypharmacy and UTI contributing. Suspect close to usual baseline. Suggest Psych eval in the outpatient setting for optimization of multiple psych medications -as above  4. Urinary tract infection, site not specified  -placed on IV Rocephin.Preliminary urine culture positive for gram negative rods-please follow final sensitivity. -discharged with PO Keflex 500 QID.  5. Bipolar 1 disorder, depressed, moderate  -Continued with home Paxil Rx and Buproprion Rx.  -Discharged with home dose  6. Hypothyroid  -Continued with Levothyroxine 100 mcg Rx -Discharged with home dose  6. Tobacco use  -counseled extensively   TODAY-DAY OF DISCHARGE:  Subjective:   [name] today has no headache, no chest or abdominal pain, no new weakness, tingling or numbness, feels much better wants to go home today.    Objective:   Blood pressure 114/76, pulse 83, temperature 98.3 F (36.8 C), temperature source Oral, resp. rate 15, height 5\' 6"   (1.676 m), weight 63.5 kg (139 lb 15.9 oz), SpO2 96.00%.  Intake/Output Summary (Last 24 hours) at 03/08/14 1119 Last data filed at 03/08/14 [id number]  Gross per 24 hour  Intake   1177 ml  Output      0 ml  Net   1177 ml   Filed Weights   03/06/14 2259  Weight: 63.5 kg (139 lb 15.9 oz)    Exam  PSYCH: She is alert and oriented x3; does not appear anxious; affect is normal  HEENT: Normocephalic and Atraumatic, No Cervical Lymphadenopathy nor Thyromegaly  LUNGS: Normal respiration, clear to auscultation bilaterally  HEART: Regular rate and rhythm; no murmurs rubs or gallops noted ABDOMEN: Positive Bowel Sounds, Soft Non-Tender; No Masses, No Organomegaly.  EXTREMITIES: No Cyanosis, Clubbing, or Edema  PULSES: 2+ and symmetric  SKIN: Normal hydration, no rash  CNS: No Focal Deficits    DISCHARGE CONDITION: Stable  DISPOSITION: SNF  DISCHARGE INSTRUCTIONS:    Activity:  As tolerated, no restrictions  Diet recommendation: Regular Diet Discharge Instructions   Activity as tolerated - No restrictions    Complete by:  As directed      Call MD for:  difficulty breathing, headache or visual disturbances    Complete by:  As directed      Diet general    Complete by:  As directed      Increase activity slowly    Complete by:  As directed           Follow-up Information   Follow up with Aida PufferLITTLE,JAMES, MD. (after discharge from SNF)    Specialty:  Family Medicine   Contact information:   [address] [place] [id number] [phone]         Total Time spent on discharge equals 45 minutes.  Signed: Julieta BelliniWaqas,Tariq PA-S, [employer] 03/08/2014 11:19 AM  **Disclaimer: This note may have been dictated with voice recognition software. Similar sounding words can inadvertently be transcribed and this note may contain transcription errors which may not have been corrected upon publication of note.**  Attending Patient was seen, examined,treatment plan was discussed with  the Physician extender. I have directly reviewed the clinical findings, lab, imaging studies and management of this patient in detail. I have made the necessary changes to the above noted documentation, and agree with the documentation, as recorded by the Physician extender.  Windell NorfolkS Ghimire MD [facility].

## 2014-03-07 NOTE — Progress Notes (Signed)
Subjective: No seizure activity overnight. She has no complaints. Per her family at her bedside, her speech is back to baseline in terms of being able to be understood but at times she gets confused or gets off track with conversation which is not usual for her.   Objective: Current vital signs: BP 115/72  Pulse 90  Temp(Src) 99.3 F (37.4 C) (Oral)  Resp 18  Ht 5\' 6"  (1.676 m)  Wt 139 lb 15.9 oz (63.5 kg)  BMI 22.61 kg/m2  SpO2 96% Vital signs in last 24 hours: Temp:  [98.3 F (36.8 C)-99.3 F (37.4 C)] 99.3 F (37.4 C) (07/29 0426) Pulse Rate:  [90-109] 90 (07/29 0426) Resp:  [14-21] 18 (07/29 0426) BP: (115-148)/(72-99) 115/72 mmHg (07/29 0426) SpO2:  [94 %-99 %] 96 % (07/29 0426) Weight:  [139 lb 15.9 oz (63.5 kg)] 139 lb 15.9 oz (63.5 kg) (07/28 2259)  Intake/Output from previous day: 07/28 0701 - 07/29 0700 In: 550 [I.V.:500; IV Piggyback:50] Out: -  Intake/Output this shift: Total I/O In: 594 [P.O.:444; I.V.:150] Out: -  Nutritional status: Cardiac  Neurologic Exam: General: Mental Status: Alert, oriented to place and person, not able to give the year.  Speech fluent. Normal repetition. Names objects.  Able to follow 3 step commands without difficulty. Cranial Nerves: II: Visual fields grossly normal, pupils equal, round, reactive to light and accommodation III,IV, VI: ptosis not present, extra-ocular motions intact bilaterally V,VII: smile symmetric, facial light touch sensation normal bilaterally VIII: hearing per finger-rub test slightly decreased in the left IX,X: gag reflex present XI: bilateral shoulder shrug XII: midline tongue extension without atrophy or fasciculations  Motor: Right : Upper extremity   5/5    Left:     Upper extremity   5/5  Lower extremity   5/5     Lower extremity   5/5 Tone and bulk:normal tone throughout; no atrophy noted Sensory: Pinprick and light touch intact throughout, bilaterally Deep Tendon Reflexes:  2+ and symmetric  in the biceps and patellae Plantars: Right: downgoing   Left: downgoing Cerebellar: normal finger-to-nose Gait: Unable to assess CV: pulses palpable throughout    Lab Results: Basic Metabolic Panel:  Recent Labs Lab 03/06/14 1625 03/07/14 0601  NA 130* 142  K 3.5* 3.2*  CL 96 104  CO2  --  18*  GLUCOSE 104* 80  BUN 9 6  CREATININE 0.90 0.75  CALCIUM  --  8.3*   CBC:  Recent Labs Lab 03/06/14 1552 03/06/14 1625 03/07/14 0601  WBC 7.7  --  5.4  NEUTROABS 6.3  --   --   HGB 13.9 15.3* 13.2  HCT 40.0 45.0 39.5  MCV 97.3  --  101.3*  PLT 247  --  212    Imaging: Ct Head Wo Contrast  03/06/2014   CLINICAL DATA:  Altered mental status, slurred speech, tonic-clonic seizure.  EXAM: CT HEAD WITHOUT CONTRAST  TECHNIQUE: Contiguous axial images were obtained from the base of the skull through the vertex without intravenous contrast.  COMPARISON:  None.  FINDINGS: Mild periventricular and subcortical white matter hypodensities, a nonspecific finding often seen in the setting of chronic microangiopathic change. The ventricles, cisterns, sulci are normal in size, shape, and position for age. No intraparenchymal hemorrhage, mass, mass effect, or abnormal extra-axial fluid collection. Partially opacified left ethmoid air cells. Mild left maxillary sinus mucosal thickening. Mastoid air cells are predominantly clear. Atherosclerotic vascular calcifications. Lens resections.  IMPRESSION: Mild white matter changes as above. No definite CT evidence  of acute intracranial abnormality. MRI recommended if concern for acute ischemia persists.  Partially opacified left ethmoid air cells. Correlate clinically for sinusitis.   Electronically Signed   By: Jearld Lesch M.D.   On: 03/06/2014 17:06    Medications: I have reviewed the patient's current medications. Current facility-administered medications:acetaminophen (TYLENOL) suppository 650 mg, 650 mg, Rectal, Q6H PRN, Ron Parker, MD;   acetaminophen (TYLENOL) tablet 650 mg, 650 mg, Oral, Q6H PRN, Ron Parker, MD;  alum & mag hydroxide-simeth (MAALOX/MYLANTA) 200-200-20 MG/5ML suspension 30 mL, 30 mL, Oral, Q6H PRN, Ron Parker, MD buPROPion [facility] SR) 12 hr tablet 200 mg, 200 mg, Oral, Daily, Ron Parker, MD, 200 mg at 03/07/14 1029;  cefTRIAXone (ROCEPHIN) 1 g in dextrose 5 % 50 mL IVPB, 1 g, Intravenous, Q24H, Ron Parker, MD, 1 g at 03/07/14 1610;  clonazePAM (KLONOPIN) tablet 0.5 mg, 0.5 mg, Oral, TID, Ron Parker, MD, 0.5 mg at 03/07/14 1325;  enoxaparin (LOVENOX) injection 40 mg, 40 mg, Subcutaneous, Q24H, Shanker Levora Dredge, MD HYDROmorphone (DILAUDID) injection 0.5-1 mg, 0.5-1 mg, Intravenous, Q3H PRN, Ron Parker, MD;  hydrOXYzine (ATARAX/VISTARIL) tablet 100 mg, 100 mg, Oral, TID PRN, Ron Parker, MD;  levothyroxine (SYNTHROID, LEVOTHROID) tablet 100 mcg, 100 mcg, Oral, QAC breakfast, Ron Parker, MD, 100 mcg at 03/07/14 0758;  LORazepam (ATIVAN) injection 1 mg, 1 mg, Intravenous, Q6H PRN, Ron Parker, MD ondansetron (ZOFRAN) injection 4 mg, 4 mg, Intravenous, Q6H PRN, Ron Parker, MD;  ondansetron (ZOFRAN) tablet 4 mg, 4 mg, Oral, Q6H PRN, Ron Parker, MD;  oxyCODONE (Oxy IR/ROXICODONE) immediate release tablet 5 mg, 5 mg, Oral, Q4H PRN, Ron Parker, MD;  PARoxetine (PAXIL) tablet 10 mg, 10 mg, Oral, Daily, Ron Parker, MD, 10 mg at 03/07/14 1026 QUEtiapine (SEROQUEL) tablet 600 mg, 600 mg, Oral, QHS, Harvette Velora Heckler, MD, 600 mg at 03/07/14 0026;  sodium chloride 0.9 % injection 3 mL, 3 mL, Intravenous, Q12H, Ron Parker, MD  Leonia Reeves, MD PGY3 IMTS Working with [facility] [phone] 03/07/2014, 2:32 PM   Patient seen and examined.  Clinical course and management discussed.  Necessary edits performed.  I agree with the above.  Assessment and plan of care developed and discussed below.     Assessment/Plan: 63 yr old woman with confusion and generalized tonic clonic seizure in context of one week following abrupt cessation of chronic benzodiazapine use. Withdrawal from benzodiazapine is very likely. No aphasia currently noted on examination.  EEG unremarkable.  Recommendations:  1) MRI brain pending 2) follow up on EEG, read is pending 3) Continue benzodiazepine with slow taper  Neurology will continue following     Thana Farr, MD [facility] [phone]  03/07/2014  6:04 PM

## 2014-03-07 NOTE — Progress Notes (Signed)
PATIENT DETAILS Name: [name] Age: 64 y.o. Sex: female Date of Birth: Nov 03, 1950 Admit Date: 03/06/2014 Admitting Physician Ron Parker, MD ZOX:WRUEAV,WUJWJ, MD  Subjective: No major issues overnight.  Assessment/Plan: Principal Problem: Seizure  -secondary to abrupt Benzo withdrawal. -await MRI Brain/EEG -restarted back on Klonopin  Active Problems: Encephalopathy -multifactorial-benzo withdrawal,polypharmacy and UTI contributing -as above  Polypharmacy -intermittently confused-suspect multiple medications as the culprit -will ask psych to evaluate    Bipolar 1 disorder, depressed, moderate - c/w Paxil, Seroquel,Klonopin-await psych eval  UTI -c/w Rocephin -await cultures    Hypothyroid -c/w levothyroxine  Disposition: Remain inpatient  DVT Prophylaxis: Prophylactic Lovenox   Code Status: Full code   Family Communication Sister at bedside  Procedures:  None  CONSULTS:  neurology  Time spent 40 minutes-which includes 50% of the time with face-to-face with patient/ family and coordinating care related to the above assessment and plan.   MEDICATIONS: Scheduled Meds: . buPROPion  200 mg Oral Daily  . cefTRIAXone (ROCEPHIN)  IV  1 g Intravenous Q24H  . clonazePAM  0.5 mg Oral TID  . levothyroxine  100 mcg Oral QAC breakfast  . PARoxetine  10 mg Oral Daily  . QUEtiapine  600 mg Oral QHS  . sodium chloride  3 mL Intravenous Q12H   Continuous Infusions:  PRN Meds:.acetaminophen, acetaminophen, alum & mag hydroxide-simeth, HYDROmorphone (DILAUDID) injection, hydrOXYzine, LORazepam, ondansetron (ZOFRAN) IV, ondansetron, oxyCODONE  Antibiotics: Anti-infectives   Start     Dose/Rate Route Frequency Ordered Stop   03/06/14 2300  cefTRIAXone (ROCEPHIN) 1 g in dextrose 5 % 50 mL IVPB     1 g 100 mL/hr over 30 Minutes Intravenous Every 24 hours 03/06/14 2256         PHYSICAL EXAM: Vital signs in last 24 hours: Filed  Vitals:   03/06/14 2100 03/06/14 2200 03/06/14 2259 03/07/14 0426  BP: 134/93 138/89 148/72 115/72  Pulse: 106 94 92 90  Temp:   98.8 F (37.1 C) 99.3 F (37.4 C)  TempSrc:   Oral Oral  Resp: 14 20 18 18   Height:   5\' 6"  (1.676 m)   Weight:   63.5 kg (139 lb 15.9 oz)   SpO2: 98% 96% 96% 96%    Weight change:  Filed Weights   03/06/14 2259  Weight: 63.5 kg (139 lb 15.9 oz)   Body mass index is 22.61 kg/(m^2).   Gen Exam: Awake and mostly alert with clear speech.   Neck: Supple, No JVD.   Chest: B/L Clear.   CVS: S1 S2 Regular, no murmurs.  Abdomen: soft, BS +, non tender, non distended.  Extremities: no edema, lower extremities warm to touch. Neurologic: Non Focal.   Skin: No Rash.   Wounds: N/A.   Intake/Output from previous day:  Intake/Output Summary (Last 24 hours) at 03/07/14 1328 Last data filed at 03/07/14 1100  Gross per 24 hour  Intake   1144 ml  Output      0 ml  Net   1144 ml     LAB RESULTS: CBC  Recent Labs Lab 03/06/14 1552 03/06/14 1625 03/07/14 0601  WBC 7.7  --  5.4  HGB 13.9 15.3* 13.2  HCT 40.0 45.0 39.5  PLT 247  --  212  MCV 97.3  --  101.3*  MCH 33.8  --  33.8  MCHC 34.8  --  33.4  RDW 13.2  --  13.6  LYMPHSABS 0.8  --   --  MONOABS 0.6  --   --   EOSABS 0.0  --   --   BASOSABS 0.0  --   --     Chemistries   Recent Labs Lab 03/06/14 1625 03/07/14 0601  NA 130* 142  K 3.5* 3.2*  CL 96 104  CO2  --  18*  GLUCOSE 104* 80  BUN 9 6  CREATININE 0.90 0.75  CALCIUM  --  8.3*    CBG: No results found for this basename: GLUCAP,  in the last 168 hours  GFR Estimated Creatinine Clearance: 68.3 ml/min (by C-G formula based on Cr of 0.75).  Coagulation profile No results found for this basename: INR, PROTIME,  in the last 168 hours  Cardiac Enzymes No results found for this basename: CK, CKMB, TROPONINI, MYOGLOBIN,  in the last 168 hours  No components found with this basename: POCBNP,  No results found for this  basename: DDIMER,  in the last 72 hours No results found for this basename: HGBA1C,  in the last 72 hours No results found for this basename: CHOL, HDL, LDLCALC, TRIG, CHOLHDL, LDLDIRECT,  in the last 72 hours No results found for this basename: TSH, T4TOTAL, FREET3, T3FREE, THYROIDAB,  in the last 72 hours No results found for this basename: VITAMINB12, FOLATE, FERRITIN, TIBC, IRON, RETICCTPCT,  in the last 72 hours No results found for this basename: LIPASE, AMYLASE,  in the last 72 hours  Urine Studies No results found for this basename: UACOL, UAPR, USPG, UPH, UTP, UGL, UKET, UBIL, UHGB, UNIT, UROB, ULEU, UEPI, UWBC, URBC, UBAC, CAST, CRYS, UCOM, BILUA,  in the last 72 hours  MICROBIOLOGY: No results found for this or any previous visit (from the past 240 hour(s)).  RADIOLOGY STUDIES/RESULTS: Ct Head Wo Contrast  03/06/2014   CLINICAL DATA:  Altered mental status, slurred speech, tonic-clonic seizure.  EXAM: CT HEAD WITHOUT CONTRAST  TECHNIQUE: Contiguous axial images were obtained from the base of the skull through the vertex without intravenous contrast.  COMPARISON:  None.  FINDINGS: Mild periventricular and subcortical white matter hypodensities, a nonspecific finding often seen in the setting of chronic microangiopathic change. The ventricles, cisterns, sulci are normal in size, shape, and position for age. No intraparenchymal hemorrhage, mass, mass effect, or abnormal extra-axial fluid collection. Partially opacified left ethmoid air cells. Mild left maxillary sinus mucosal thickening. Mastoid air cells are predominantly clear. Atherosclerotic vascular calcifications. Lens resections.  IMPRESSION: Mild white matter changes as above. No definite CT evidence of acute intracranial abnormality. MRI recommended if concern for acute ischemia persists.  Partially opacified left ethmoid air cells. Correlate clinically for sinusitis.   Electronically Signed   By: Jearld LeschAndrew  DelGaizo M.D.   On:  03/06/2014 17:06    Jeoffrey MassedGHIMIRE,SHANKER, MD  [facility] Pager:[phone]  If 7PM-7AM, please contact night-coverage [email or web address] Password [email or web address] 03/07/2014, 1:28 PM   LOS: 1 day   **Disclaimer: This note may have been dictated with voice recognition software. Similar sounding words can inadvertently be transcribed and this note may contain transcription errors which may not have been corrected upon publication of note.**

## 2014-03-08 MED ORDER — CLONAZEPAM 0.5 MG PO TABS: 0.5000 mg | ORAL_TABLET | Freq: Three times a day (TID) | ORAL | Status: DC

## 2014-03-08 MED ORDER — LORAZEPAM 2 MG/ML IJ SOLN: 1.0000 mg | Freq: Once | INTRAMUSCULAR | Status: DC

## 2014-03-08 MED ORDER — CEPHALEXIN 500 MG PO CAPS: 500.0000 mg | ORAL_CAPSULE | Freq: Four times a day (QID) | ORAL | Status: DC

## 2014-03-08 NOTE — Progress Notes (Signed)
Subjective: No seizure-like activity overnight. She continues to be somewhat confused. She refused MRI.  Objective: Current vital signs: BP 114/76  Pulse 83  Temp(Src) 98.3 F (36.8 C) (Oral)  Resp 15  Ht 5\' 6"  (1.676 m)  Wt 139 lb 15.9 oz (63.5 kg)  BMI 22.61 kg/m2  SpO2 96% Vital signs in last 24 hours: Temp:  [97.9 F (36.6 C)-99 F (37.2 C)] 98.3 F (36.8 C) (07/30 0612) Pulse Rate:  [83-128] 83 (07/30 0612) Resp:  [15-24] 15 (07/30 0612) BP: (111-127)/(76-93) 114/76 mmHg (07/30 0612) SpO2:  [94 %-96 %] 96 % (07/30 0612)  Intake/Output from previous day: 07/29 0701 - 07/30 0700 In: 1531 [P.O.:1381; I.V.:150] Out: -  Intake/Output this shift: Total I/O In: 240 [P.O.:240] Out: -  Nutritional status: Cardiac  Neurologic Exam: General: Sitting in chair, in NAD Mental Status: Alert, oriented to person and time (able to give the year), but not to place (unable to give name of facility, thinks she is in Rehab).  Speech fluent though she goes off track at times. Normal repetition. Names objects.  Able to follow 3 step commands without difficulty. Cranial Nerves: II: Visual fields grossly normal, pupils equal, round, reactive to light and accommodation III,IV, VI: ptosis not present, extra-ocular motions intact bilaterally V,VII: smile symmetric, facial light touch sensation normal bilaterally VIII: hearing per finger-rub test slightly decreased in the left IX,X: gag reflex present XI: bilateral shoulder shrug present XII: midline tongue extension without atrophy or fasciculations  Motor: Right : Upper extremity   5/5    Left:     Upper extremity   5/5  Lower extremity   5/5     Lower extremity   5/5 Tone and bulk:normal tone throughout; no atrophy noted Sensory: Pinprick and light touch intact throughout, bilaterally Deep Tendon Reflexes:  2+ and symmetric in the biceps and patellae Plantars: Right: downgoing   Left: downgoing Cerebellar: normal  finger-to-nose Gait: Unable to assess CV: pulses palpable throughout    Lab Results: Basic Metabolic Panel:  Recent Labs Lab 03/06/14 1625 03/07/14 0601  NA 130* 142  K 3.5* 3.2*  CL 96 104  CO2  --  18*  GLUCOSE 104* 80  BUN 9 6  CREATININE 0.90 0.75  CALCIUM  --  8.3*    CBC:  Recent Labs Lab 03/06/14 1552 03/06/14 1625 03/07/14 0601  WBC 7.7  --  5.4  NEUTROABS 6.3  --   --   HGB 13.9 15.3* 13.2  HCT 40.0 45.0 39.5  MCV 97.3  --  101.3*  PLT 247  --  212    Microbiology: Results for orders placed during the hospital encounter of 03/06/14  URINE CULTURE     Status: None   Collection Time    03/06/14  6:03 PM      Result Value Ref Range Status   Specimen Description URINE, RANDOM   Final   Special Requests ADDED [id number]   Final   Culture  Setup Time     Final   Value: 03/07/2014 08:33     Performed at [facility]   Colony Count     Final   Value: >=100,000 COLONIES/ML     Performed at [facility]   Culture     Final   Value: GRAM NEGATIVE RODS     Performed at [facility]   Report Status PENDING   Incomplete    Imaging: Ct Head Wo Contrast  03/06/2014   CLINICAL  DATA:  Altered mental status, slurred speech, tonic-clonic seizure.  EXAM: CT HEAD WITHOUT CONTRAST  TECHNIQUE: Contiguous axial images were obtained from the base of the skull through the vertex without intravenous contrast.  COMPARISON:  None.  FINDINGS: Mild periventricular and subcortical white matter hypodensities, a nonspecific finding often seen in the setting of chronic microangiopathic change. The ventricles, cisterns, sulci are normal in size, shape, and position for age. No intraparenchymal hemorrhage, mass, mass effect, or abnormal extra-axial fluid collection. Partially opacified left ethmoid air cells. Mild left maxillary sinus mucosal thickening. Mastoid air cells are predominantly clear. Atherosclerotic vascular calcifications. Lens resections.   IMPRESSION: Mild white matter changes as above. No definite CT evidence of acute intracranial abnormality. MRI recommended if concern for acute ischemia persists.  Partially opacified left ethmoid air cells. Correlate clinically for sinusitis.   Electronically Signed   By: Jearld Lesch M.D.   On: 03/06/2014 17:06    Medications: I have reviewed the patient's current medications.  Marland Kitchen buPROPion  200 mg Oral Daily  . cefTRIAXone (ROCEPHIN)  IV  1 g Intravenous Q24H  . clonazePAM  0.5 mg Oral TID  . enoxaparin (LOVENOX) injection  40 mg Subcutaneous Q24H  . levothyroxine  100 mcg Oral QAC breakfast  . LORazepam  1 mg Intravenous Once  . PARoxetine  10 mg Oral Daily  . QUEtiapine  600 mg Oral QHS  . sodium chloride  3 mL Intravenous Q12H   Assessment/Plan: 63 yr old woman with confusion and generalized tonic clonic seizure in context of one week following abrupt cessation of chronic benzodiazapine use. Withdrawal from benzodiazapine is very likely. No aphasia currently noted on examination but she does have confusion, going off track at times which is close to her baseline. The etiology for her confusion is likely multifactorial, due to UTI, benzo withdrawal, and polypharmacy. EEG unremarkable for seizures. Patient refused MRI.   Recommendations:  1) Continue benzodiazepine with slow taper 2) Will defer MRI at this time 3) Will sign off   Leonia Reeves, MD PGY-3 IMTS Working with [facility] [phone] 03/08/2014, 11:07 AM  Patient seen and examined.  Clinical course and management discussed.  Necessary edits performed.  I agree with the above.    Thana Farr, MD [facility] [phone]  03/08/2014  12:48 PM

## 2014-03-08 NOTE — Progress Notes (Signed)
Patient was discharged to nursing home by MD order; discharged instructions  review and sent to facility [facility] with care notes and prescriptions; IV DIC; skin intact; facility was called and report was given to nurse; family at bedside;  patient will be transported to the facility via EMS

## 2014-03-08 NOTE — Clinical Social Work Placement (Signed)
Clinical Social Work Department CLINICAL SOCIAL WORK PLACEMENT NOTE 03/08/2014  Patient:  [name],[name]  Account Number:  [id number] Admit date:  03/06/2014  Clinical Social Worker:  Lavell LusterJOSEPH BRYANT [name], LCSWA  Date/time:  03/08/2014 03:01 PM  Clinical Social Work is seeking post-discharge placement for this patient at the following level of care:   SKILLED NURSING   (*CSW will update this form in Epic as items are completed)   03/08/2014  [facility]/family provided with [facility] Department of Clinical Social Work'[name] list of facilities offering this level of care within the geographic area requested by the patient (or if unable, by the patient'[name] family).  03/08/2014  Patient/family informed of their freedom to choose among providers that offer the needed level of care, that participate in Medicare, Medicaid or managed care program needed by the patient, have an available bed and are willing to accept the patient.  03/08/2014  Patient/family informed of [facility]' ownership interest in [facility], as well as of the fact that they are under no obligation to receive care at this facility.  PASARR submitted to EDS on 03/08/2014 PASARR number received on 03/08/2014  FL2 transmitted to all facilities in geographic area requested by pt/family on  03/08/2014 FL2 transmitted to all facilities within larger geographic area on   Patient informed that his/her managed care company has contracts with or will negotiate with  certain facilities, including the following:     Patient/family informed of bed offers received:  03/08/2014 [facility] [place] Physician recommends and patient chooses bed at    [facility] [place] on  03/08/2014 Patient to be transferred to facility by Ambulance Patient and family notified of transfer on 03/08/2014 Name of family member notified:  Drinda ButtsAnnette and daughter  The  following physician request were entered in Epic:   Additional Comments: Per MD [facility]. RN, patient, patient'[name] sister and daughter, and facility notified of DC. RN given number for report. DC packet on chart. Ambulance transport requested. CSW signing off.    Roddie McBryant Coy [place]ford MSW, [place]WA, [place], [phone]

## 2014-03-09 ENCOUNTER — Other Ambulatory Visit: Payer: Self-pay | Admitting: *Deleted

## 2014-03-09 LAB — URINE CULTURE: Colony Count: >=100000

## 2014-03-09 MED ORDER — ACETAMINOPHEN-CODEINE #4 300-60 MG PO TABS: 2.0000 | ORAL_TABLET | Freq: Four times a day (QID) | ORAL | Status: DC | PRN

## 2014-03-09 MED ORDER — CLONAZEPAM 0.5 MG PO TABS
0.5000 mg | ORAL_TABLET | Freq: Three times a day (TID) | ORAL | Status: DC
Start: 2014-03-09 — End: 2014-05-17

## 2014-03-09 NOTE — Telephone Encounter (Signed)
rx faxed to [facility] @ [phone] 

## 2014-03-12 ENCOUNTER — Encounter: Payer: Self-pay | Admitting: Internal Medicine

## 2014-03-12 ENCOUNTER — Non-Acute Institutional Stay (SKILLED_NURSING_FACILITY): Payer: PRIVATE HEALTH INSURANCE | Admitting: Internal Medicine

## 2014-03-12 DIAGNOSIS — R5383 Other fatigue: Secondary | ICD-10-CM

## 2014-03-12 DIAGNOSIS — R5381 Other malaise: Secondary | ICD-10-CM

## 2014-03-12 DIAGNOSIS — R531 Weakness: Secondary | ICD-10-CM

## 2014-03-12 DIAGNOSIS — E038 Other specified hypothyroidism: Secondary | ICD-10-CM

## 2014-03-12 DIAGNOSIS — F411 Generalized anxiety disorder: Secondary | ICD-10-CM

## 2014-03-12 DIAGNOSIS — E876 Hypokalemia: Secondary | ICD-10-CM

## 2014-03-12 DIAGNOSIS — N390 Urinary tract infection, site not specified: Secondary | ICD-10-CM

## 2014-03-12 DIAGNOSIS — F3132 Bipolar disorder, current episode depressed, moderate: Secondary | ICD-10-CM

## 2014-03-12 NOTE — Progress Notes (Signed)
Patient ID: [name], female   DOB: Sep 26, 1950, 63 y.o.   MRN: [id number]     Facility: [facility]   PCP: Aida Puffer, MD  Code Status: full code  No Known Allergies  Chief Complaint: new admission  HPI:  63 y/o female patient is here for STR after hospital admission on 03/06/14 with BZD withdrawal seizure and klebsiella UTI. She has completed her antibiotics and is currently on klonopin 0.5 mg tid. She is working well with therapy team and is ambulating without any assistance. She denies any complaints today. No concern from staff  Review of Systems:  Constitutional: Negative for fever, chills, malaise/fatigue and diaphoresis.  HENT: Negative for congestion, hearing loss and sore throat.   Eyes: Negative for blurred vision, double vision and discharge.  Respiratory: Negative for cough, sputum production, shortness of breath and wheezing.   Cardiovascular: Negative for chest pain, palpitations, orthopnea and leg swelling.  Gastrointestinal: Negative for heartburn, nausea, vomiting, abdominal pain, diarrhea  Genitourinary: Negative for dysuria Musculoskeletal: Negative for back pain, falls Skin: Negative for itching and rash.  Neurological: Negative for weakness,dizziness, tingling, focal weakness and headaches.  Psychiatric/Behavioral: Negative for depression    Past Medical History  Diagnosis Date  . Asthma   . Depression   . GERD (gastroesophageal reflux disease)   . Inflammatory polyps of colon     pt notes flat and is to have surgery to remove   . Osteoarthritis of both knees   . Hypothyroidism   . H/O hiatal hernia   . Seizure 03/06/2014    tonic clonic seizure which lasted about a minute  . Anxiety   . Bipolar disorder   . Benzodiazepine withdrawal with delirium 03/06/2014   Past Surgical History  Procedure Laterality Date  . Cataract extraction w/ intraocular lens  implant, bilateral Bilateral   . Rotator cuff repair Right   .  Vaginal hysterectomy  01/2003    lap assisted w/BSO/notes 12/23/2010  . Tubal ligation     Social History:   reports that she has been smoking Cigarettes.  She has a 44 pack-year smoking history. She has never used smokeless tobacco. She reports that she does not drink alcohol or use illicit drugs.  Family History  Problem Relation Age of Onset  . Cancer Mother   . Cancer Brother   . Cancer Brother     Medications: Patient's Medications  New Prescriptions   No medications on file  Previous Medications   ACETAMINOPHEN-CODEINE (TYLENOL #4) 300-60 MG PER TABLET    Take 2 tablets by mouth 4 (four) times daily as needed for moderate pain. Take 2 tablets by mouth every 6 hours as needed for pain   ALBUTEROL (PROVENTIL HFA;VENTOLIN HFA) 108 (90 BASE) MCG/ACT INHALER    Inhale 2 puffs into the lungs every 4 (four) hours as needed for wheezing or shortness of breath.   BUPROPION (WELLBUTRIN SR) 200 MG 12 HR TABLET    Take 200 mg by mouth daily.   CEPHALEXIN (KEFLEX) 500 MG CAPSULE    Take 1 capsule (500 mg total) by mouth 4 (four) times daily. 3 more days from 03/08/14   CLONAZEPAM (KLONOPIN) 0.5 MG TABLET    Take 1 tablet (0.5 mg total) by mouth 3 (three) times daily.   HYDROXYZINE (VISTARIL) 100 MG CAPSULE    Take 100 mg by mouth 3 (three) times daily as needed. For nerves   LEVOTHYROXINE (SYNTHROID, LEVOTHROID) 100 MCG TABLET    Take 100 mcg by  mouth daily before breakfast.   PAROXETINE (PAXIL) 10 MG TABLET    Take 10 mg by mouth daily.   QUETIAPINE (SEROQUEL) 400 MG TABLET    Take 600 mg by mouth at bedtime.  Modified Medications   No medications on file  Discontinued Medications   No medications on file     Physical Exam: VSS, afebrile  General- elderly female in no acute distress, thin built Head- atraumatic, normocephalic Eyes- PERRLA, EOMI, no pallor, no icterus, no discharge Neck- no lymphadenopathy Throat- moist mucus membrane Cardiovascular- normal s1,s2, no  murmurs Respiratory- bilateral clear to auscultation, no wheeze, no rhonchi, no crackles, no use of accessory muscles Abdomen- bowel sounds present, soft, non tender Musculoskeletal- able to move all 4 extremities, normal range of motion, no leg edema Neurological- no focal deficit Skin- warm and dry Psychiatry- alert and oriented to person, place and time   Labs reviewed: Basic Metabolic Panel:  Recent Labs  56/21/30 1625 03/07/14 0601  NA 130* 142  K 3.5* 3.2*  CL 96 104  CO2  --  18*  GLUCOSE 104* 80  BUN 9 6  CREATININE 0.90 0.75  CALCIUM  --  8.3*   Liver Function Tests: No results found for this basename: AST, ALT, ALKPHOS, BILITOT, PROT, ALBUMIN,  in the last 8760 hours No results found for this basename: LIPASE, AMYLASE,  in the last 8760 hours No results found for this basename: AMMONIA,  in the last 8760 hours CBC:  Recent Labs  03/06/14 1552 03/06/14 1625 03/07/14 0601  WBC 7.7  --  5.4  NEUTROABS 6.3  --   --   HGB 13.9 15.3* 13.2  HCT 40.0 45.0 39.5  MCV 97.3  --  101.3*  PLT 247  --  212   Radiological exam Ct Head Wo Contrast  03/06/2014   CLINICAL DATA:  Altered mental status, slurred speech, tonic-clonic seizure.  EXAM: CT HEAD WITHOUT CONTRAST  TECHNIQUE: Contiguous axial images were obtained from the base of the skull through the vertex without intravenous contrast.  COMPARISON:  None.  FINDINGS: Mild periventricular and subcortical white matter hypodensities, a nonspecific finding often seen in the setting of chronic microangiopathic change. The ventricles, cisterns, sulci are normal in size, shape, and position for age. No intraparenchymal hemorrhage, mass, mass effect, or abnormal extra-axial fluid collection. Partially opacified left ethmoid air cells. Mild left maxillary sinus mucosal thickening. Mastoid air cells are predominantly clear. Atherosclerotic vascular calcifications. Lens resections.  IMPRESSION: Mild white matter changes as above. No  definite CT evidence of acute intracranial abnormality. MRI recommended if concern for acute ischemia persists.  Partially opacified left ethmoid air cells. Correlate clinically for sinusitis.   Electronically Signed   By: Jearld Lesch M.D.   On: 03/06/2014 17:06     Assessment/Plan  Generalized weakness Will have patient work with PT/OT as tolerated to regain strength and restore function.  Fall precautions are in place.  Hypokalemia Will need BMP checked in am and replete k if less than 3.5  Klebsiella uti Completed course of keflex for klebsiella uit. Pt afebrile and asymptomatic at present. Encouraged hydration  Anxiety Continue klonopin 0.5 mg tid, very slow taper over several months given her recent seizure from withdrawal - spoke with hospital MD over phone and confirmed on this. Continue hydroxyzine for now  Bipolar disorder Continue her paxil and seroquel Hypothyroidism Continue her levothyroxine  Family/ staff Communication: reviewed care plan with patient and nursing supervisor   Goals of care: short  term rehabilitation   Labs/tests ordered: bmp    Oneal GroutMAHIMA Leeasia Secrist, MD  [facility] [phone] (Monday-Friday 8 am - 5 pm) [phone] (afterhours)

## 2014-03-13 ENCOUNTER — Non-Acute Institutional Stay (SKILLED_NURSING_FACILITY): Payer: PRIVATE HEALTH INSURANCE | Admitting: Internal Medicine

## 2014-03-13 DIAGNOSIS — R569 Unspecified convulsions: Secondary | ICD-10-CM

## 2014-03-13 DIAGNOSIS — F13939 Sedative, hypnotic or anxiolytic use, unspecified with withdrawal, unspecified: Secondary | ICD-10-CM

## 2014-03-13 DIAGNOSIS — F19239 Other psychoactive substance dependence with withdrawal, unspecified: Secondary | ICD-10-CM

## 2014-03-13 DIAGNOSIS — R531 Weakness: Secondary | ICD-10-CM

## 2014-03-13 DIAGNOSIS — F1320 Sedative, hypnotic or anxiolytic dependence, uncomplicated: Secondary | ICD-10-CM

## 2014-03-13 DIAGNOSIS — F411 Generalized anxiety disorder: Secondary | ICD-10-CM

## 2014-03-13 DIAGNOSIS — E038 Other specified hypothyroidism: Secondary | ICD-10-CM

## 2014-03-13 DIAGNOSIS — R5381 Other malaise: Secondary | ICD-10-CM

## 2014-03-13 DIAGNOSIS — F19939 Other psychoactive substance use, unspecified with withdrawal, unspecified: Secondary | ICD-10-CM

## 2014-03-13 DIAGNOSIS — F3132 Bipolar disorder, current episode depressed, moderate: Secondary | ICD-10-CM

## 2014-03-13 DIAGNOSIS — R5383 Other fatigue: Secondary | ICD-10-CM

## 2014-03-13 DIAGNOSIS — F13239 Sedative, hypnotic or anxiolytic dependence with withdrawal, unspecified: Secondary | ICD-10-CM

## 2014-03-13 NOTE — Progress Notes (Signed)
Patient ID: [name], female   DOB: [birth year], 63 y.o.   MRN: [id number]  Location: [facility] [name], D.O., C.M.D.  PCP: Brittany PufferLITTLE,JAMES, [name]  Code Status: full code  No Known Allergies  Chief Complaint  Patient presents with  . Discharge Note    HPI:  63 y/o female patient was here for STR after hospital admission on 03/06/14 with BZD withdrawal seizure and klebsiella UTI. She completed her antibiotics and is currently on klonopin 0.5 mg tid. This is to be tapered slowly over the next month.  She worked well with therapy team and was ambulating without any assistance. She did have difficulty with right knee swelling for which she will f/u with ortho.   Review of Systems:  Review of Systems  Constitutional: Negative for fever.  HENT: Negative for congestion.   Respiratory: Negative for shortness of breath.   Cardiovascular: Negative for chest pain.  Gastrointestinal: Negative for abdominal pain.  Genitourinary: Negative for dysuria.  Musculoskeletal: Positive for joint pain.       Right Knee swelling  Skin: Negative for rash.  Neurological: Negative for dizziness and seizures.  Psychiatric/Behavioral: Positive for depression. The patient is nervous/anxious.        Bipolar     Past Medical History  Diagnosis Date  . Asthma   . Depression   . GERD (gastroesophageal reflux disease)   . Inflammatory polyps of colon     pt notes flat and is to have surgery to remove   . Osteoarthritis of both knees   . Hypothyroidism   . H/O hiatal hernia   . Seizure 03/06/2014    tonic clonic seizure which lasted about a minute  . Anxiety   . Bipolar disorder   . Benzodiazepine withdrawal with delirium 03/06/2014    Past Surgical History  Procedure Laterality Date  . Cataract extraction w/ intraocular lens  implant, bilateral Bilateral   . Rotator cuff repair Right   . Vaginal hysterectomy  01/2003    lap assisted w/BSO/notes 12/23/2010  . Tubal  ligation      Social History:   reports that she has been smoking Cigarettes.  She has a 44 pack-year smoking history. She has never used smokeless tobacco. She reports that she does not drink alcohol. Her drug history is not on file.  Family History  Problem Relation Age of Onset  . Cancer Mother   . Cancer Brother   . Cancer Brother     Medications: Patient's Medications  New Prescriptions   No medications on file  Previous Medications   ALBUTEROL (PROVENTIL HFA;VENTOLIN HFA) 108 (90 BASE) MCG/ACT INHALER    Inhale 2 puffs into the lungs every 4 (four) hours as needed for wheezing or shortness of breath.   LEVOTHYROXINE (SYNTHROID, LEVOTHROID) 100 MCG TABLET    Take 100 mcg by mouth daily before breakfast.  Modified Medications   Modified Medication Previous Medication   CLONAZEPAM (KLONOPIN) 0.5 MG TABLET clonazePAM (KLONOPIN) 0.5 MG tablet      Take 1 tablet (0.5 mg total) by mouth 2 (two) times daily.    Take 1 tablet (0.5 mg total) by mouth 2 (two) times daily.   HYDROXYZINE (VISTARIL) 50 MG CAPSULE hydrOXYzine (VISTARIL) 50 MG capsule      Take 1 capsule (50 mg total) by mouth as needed for anxiety. For nerves    Take 1 capsule (50 mg total) by mouth as needed for anxiety. For nerves   PAROXETINE (PAXIL) 40  MG TABLET PARoxetine (PAXIL) 40 MG tablet      Take 1 tablet (40 mg total) by mouth daily.    Take 1 tablet (40 mg total) by mouth daily.   QUETIAPINE (SEROQUEL) 300 MG TABLET QUEtiapine (SEROQUEL) 300 MG tablet      Take 2 tablets (600 mg total) by mouth at bedtime.    Take 2 tablets (600 mg total) by mouth at bedtime.  Discontinued Medications   ACETAMINOPHEN-CODEINE (TYLENOL #4) 300-60 MG PER TABLET    Take 2 tablets by mouth 4 (four) times daily as needed for moderate pain. Take 2 tablets by mouth every 6 hours as needed for pain   BUPROPION (WELLBUTRIN SR) 200 MG 12 HR TABLET    Take 200 mg by mouth daily.   CEPHALEXIN (KEFLEX) 500 MG CAPSULE    Take 1 capsule (500 mg  total) by mouth 4 (four) times daily. 3 more days from 03/08/14   CLONAZEPAM (KLONOPIN) 0.5 MG TABLET    Take 1 tablet (0.5 mg total) by mouth 3 (three) times daily.   HYDROXYZINE (VISTARIL) 100 MG CAPSULE    Take 100 mg by mouth 3 (three) times daily as needed. For nerves   PAROXETINE (PAXIL) 10 MG TABLET    Take 10 mg by mouth daily.   QUETIAPINE (SEROQUEL) 400 MG TABLET    Take 600 mg by mouth at bedtime.    Physical Exam: Filed Vitals:   03/13/14 1322  BP: 121/77  Pulse: 94  Temp: 98.2 F (36.8 C)  Resp: 17  Weight: 143 lb (64.864 kg)   Physical Exam  Constitutional: She is oriented to person, place, and time. She appears well-developed and well-nourished. No distress.  Cardiovascular: Normal rate, regular rhythm, normal heart sounds and intact distal pulses.   Pulmonary/Chest: Effort normal and breath sounds normal. No respiratory distress.  Abdominal: Soft. Bowel sounds are normal. She exhibits no distension and no mass. There is no tenderness.  Musculoskeletal: She exhibits edema and tenderness.  Of right knee  Neurological: She is alert and oriented to person, place, and time.  Skin: Skin is warm and dry.    Labs reviewed: Basic Metabolic Panel:  Recent Labs  16/10/96 1625 03/07/14 0601  NA 130* 142  K 3.5* 3.2*  CL 96 104  CO2  --  18*  GLUCOSE 104* 80  BUN 9 6  CREATININE 0.90 0.75  CALCIUM  --  8.3*   Liver Function Tests: No results for input(s): AST, ALT, ALKPHOS, BILITOT, PROT, ALBUMIN in the last 8760 hours. No results for input(s): LIPASE, AMYLASE in the last 8760 hours. No results for input(s): AMMONIA in the last 8760 hours. CBC:  Recent Labs  03/06/14 1552 03/06/14 1625 03/07/14 0601  WBC 7.7  --  5.4  NEUTROABS 6.3  --   --   HGB 13.9 15.3* 13.2  HCT 40.0 45.0 39.5  MCV 97.3  --  101.3*  PLT 247  --  212   Cardiac Enzymes: No results for input(s): CKTOTAL, CKMB, CKMBINDEX, TROPONINI in the last 8760 hours. BNP: Invalid input(s):  POCBNP CBG: No results for input(s): GLUCAP in the last 8760 hours. BMP pending  Assessment/Plan:   1. Benzodiazepine withdrawal with complication -cont seroquel, paxil, klonopin taper over one month, wellbutrin sr  2. Convulsions, unspecified convulsion type -due to benzo withdrawal, resolved and not expected to recur unless she goes through recurrent withdrawal  3. Bipolar 1 disorder, depressed, moderate -cont regimen as above  4. Generalized anxiety  disorder -will need very gradual taper of her klonopin over one month to prevent withdrawal  5. Generalized weakness -as completed her short term rehab and will now go with with home health RN only  6. Other specified hypothyroidism -cont synthroid daily  Patient is being discharged with home health services:  RN for med mgt  Patient is being discharged with the following durable medical equipment:  None needed  Patient has been advised to f/u with their PCP in 1-2 weeks to bring them up to date on their rehab stay.  They were provided with a 30 day supply of scripts for prescription medications and refills must be obtained from their PCP.  Has appt with Dr. Clarene Duke on 03/14/14 at 11:15am.  She should also see orthopedics about her right knee swelling.    Labs/tests ordered:  Has bmp pending

## 2014-03-23 ENCOUNTER — Other Ambulatory Visit: Payer: Self-pay | Admitting: Family Medicine

## 2014-03-23 DIAGNOSIS — Z1231 Encounter for screening mammogram for malignant neoplasm of breast: Secondary | ICD-10-CM

## 2014-04-05 ENCOUNTER — Ambulatory Visit
Admission: RE | Admit: 2014-04-05 | Discharge: 2014-04-05 | Disposition: A | Discharge status: Home / Self Care | Payer: PRIVATE HEALTH INSURANCE | Source: Ambulatory Visit | Attending: Family Medicine | Admitting: Family Medicine

## 2014-04-05 DIAGNOSIS — Z1231 Encounter for screening mammogram for malignant neoplasm of breast: Secondary | ICD-10-CM

## 2014-04-09 ENCOUNTER — Other Ambulatory Visit: Payer: Self-pay | Admitting: Family Medicine

## 2014-04-09 DIAGNOSIS — R928 Other abnormal and inconclusive findings on diagnostic imaging of breast: Secondary | ICD-10-CM

## 2014-05-02 ENCOUNTER — Ambulatory Visit (HOSPITAL_COMMUNITY): Payer: PRIVATE HEALTH INSURANCE | Admitting: Psychiatry

## 2014-05-02 ENCOUNTER — Other Ambulatory Visit: Payer: Self-pay | Admitting: Physician Assistant

## 2014-05-02 DIAGNOSIS — R928 Other abnormal and inconclusive findings on diagnostic imaging of breast: Secondary | ICD-10-CM

## 2014-05-10 ENCOUNTER — Other Ambulatory Visit: Payer: Self-pay | Admitting: Physician Assistant

## 2014-05-10 ENCOUNTER — Ambulatory Visit
Admission: RE | Admit: 2014-05-10 | Discharge: 2014-05-10 | Disposition: A | Discharge status: Home / Self Care | Payer: PRIVATE HEALTH INSURANCE | Source: Ambulatory Visit | Attending: Family Medicine | Admitting: Family Medicine

## 2014-05-10 DIAGNOSIS — R928 Other abnormal and inconclusive findings on diagnostic imaging of breast: Secondary | ICD-10-CM

## 2014-05-17 ENCOUNTER — Ambulatory Visit (INDEPENDENT_AMBULATORY_CARE_PROVIDER_SITE_OTHER): Payer: Medicaid Other | Admitting: Psychiatry

## 2014-05-17 ENCOUNTER — Ambulatory Visit
Admission: RE | Admit: 2014-05-17 | Discharge: 2014-05-17 | Disposition: A | Discharge status: Home / Self Care | Payer: Medicare Other | Source: Ambulatory Visit | Attending: Physician Assistant | Admitting: Physician Assistant

## 2014-05-17 ENCOUNTER — Encounter (HOSPITAL_COMMUNITY): Payer: Self-pay | Admitting: Psychiatry

## 2014-05-17 VITALS — BP 125/91 | HR 100 | Ht 66.5 in | Wt 151.0 lb

## 2014-05-17 DIAGNOSIS — F419 Anxiety disorder, unspecified: Secondary | ICD-10-CM

## 2014-05-17 DIAGNOSIS — F319 Bipolar disorder, unspecified: Secondary | ICD-10-CM

## 2014-05-17 DIAGNOSIS — R928 Other abnormal and inconclusive findings on diagnostic imaging of breast: Secondary | ICD-10-CM

## 2014-05-17 DIAGNOSIS — F3132 Bipolar disorder, current episode depressed, moderate: Secondary | ICD-10-CM

## 2014-05-17 MED ORDER — PAROXETINE HCL 20 MG PO TABS: 20.0000 mg | ORAL_TABLET | Freq: Every day | ORAL | Status: DC

## 2014-05-17 MED ORDER — CLONAZEPAM 0.5 MG PO TABS: 0.5000 mg | ORAL_TABLET | Freq: Two times a day (BID) | ORAL | Status: DC

## 2014-05-17 MED ORDER — HYDROXYZINE PAMOATE 50 MG PO CAPS: 50.0000 mg | ORAL_CAPSULE | Freq: Two times a day (BID) | ORAL | Status: DC

## 2014-05-17 NOTE — Progress Notes (Signed)
[facility] Initial Assessment Note  [name] [id number] 63 y.o.  05/17/2014 12:58 PM  Chief Complaint:  I need Klonopin.  I had seizures without Klonopin.  I have panic attack for a long time.   History of Present Illness:  Patient is 63 year old Caucasian, unemployed divorced female who is referred from her primary care physician Marnette Burgess from [facility] for the management of her bipolar disorder and panic attacks.  Patient was seen in this office many years ago however decided to get treatment from her primary care physician.  She was taking Klonopin, Seroquel, Paxil and Wellbutrin until recently her primary care physician refused to refill Klonopin.  Patient told that it was not given because she was not positive for benzodiazepine however her record shows that she was positive for morphine and her primary care physician refused to refill Klonopin.  The patient had seizures when she was in the hospital in July and later she was sent to rehabilitation.  Patient is not happy with her primary care physician who reduced her Klonopin.  Her new primary care physician not comfortable to provide Klonopin.  Patient told that her previous primary care physician Dr. Rex Kras accuses her that she was using more than usual but she refused to give [place] the consent to contact Dr. Rex Kras.  Patient admitted history of bipolar disorder and she is concerned that if she do not get, pain she will have panic attacks and anxiety will get worse.  Patient also endorsed issues with his sister who is managing her medication.  Initially she refused to bring her sister in the session however after some encouragement she agreed to bring her sister and the session to talk about future plans.  Patient told most of her life she does struggle with mood swings anger and pending issues.  Her sister does not like her.  Her family abandoned her.  Admitted very anxious and nervous around people.  She  sleeping on and off.  She endorsed irritability, anger, having anxiety spells and highs and lows in her mood.  However she denies any suicidal thoughts, psychosis or any hallucination.  She denies any excessive compulsive parks, nightmares, flashback or any nightmares.  She endorsed crying spells, poor attention and concentration.  She was given Klonopin by her physician Veleta Miners and she was recommended to seek psychiatrist for future refills.  Suicidal Ideation: No Plan Formed: No Patient has means to carry out plan: No  Homicidal Ideation: No Plan Formed: No Patient has means to carry out plan: No   Past Psychiatric History/Hospitalization(s) Patient endorsed history of bipolar disorder and panic attack.  She remembered having severe mood swings, anger, impulsive behavior, severe depression and then manic episodes.  She is taking Seroquel for many years.  She had tried cholesterol in the past but do not remember the details.  The patient denies any history of psychiatric inpatient treatment other than she has seizures after stopping Klonopin.  She was seen physician assistant in this office many years ago and then she decided to see primary care physician for medication management.  In the beginning she saw Dr. Arelia Sneddon however it was switched to Dr. Rex Kras.   Anxiety: Yes Bipolar Disorder: Yes Depression: Yes Mania: Yes Psychosis: No Schizophrenia: No Personality Disorder: No Hospitalization for psychiatric illness: Patient was admitted because of seizures due to stopping Klonopin. History of Electroconvulsive Shock Therapy: No Prior Suicide Attempts: No  Medical History; The patient has GERD, osteoarthritis, hypothyroidism, polyps.  Her primary care physician is [facility].  She is seeing a physician assistant Veleta Miners.  Traumatic brain injury: Patient denies any history of dramatic brain injury.  Family History; Patient denies any family history of psychiatric  illness.  Education and Work History; Patient has a high school education.  Currently she is on disability.  Psychosocial History; Patient born and raised in [place].  She has been married 3 times.  She has 2 children who live out of state.  She has sister however she does not get along with her.  She lives by herself.  Legal History; Patient denies any legal issues.  History Of Abuse; Patient denies any history of abuse.  Substance Abuse History; The patient denies any history of drinking or using any illegal substance use.  However her primary care physician refused to refill her Klonopin because her urine was positive for morphine.  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 clonazePAM (KLONOPIN) 0.5 MG tablet Take 1 tablet (0.5 mg total) by mouth 2 (two) times daily.  . hydrOXYzine (VISTARIL) 50 MG capsule Take 1 capsule (50 mg total) by mouth 2 (two) times daily. For nerves  . PARoxetine (PAXIL) 20 MG tablet Take 1 tablet (20 mg total) by mouth daily.  . QUEtiapine (SEROQUEL) 400 MG tablet Take 600 mg by mouth at bedtime.  . [DISCONTINUED] buPROPion (WELLBUTRIN SR) 200 MG 12 hr tablet Take 200 mg by mouth daily.  . [DISCONTINUED] clonazePAM (KLONOPIN) 0.5 MG tablet Take 1 tablet (0.5 mg total) by mouth 3 (three) times daily.  . [DISCONTINUED] hydrOXYzine (VISTARIL) 100 MG capsule Take 100 mg by mouth 3 (three) times daily as needed. For nerves  . [DISCONTINUED]  PARoxetine (PAXIL) 10 MG tablet Take 10 mg by mouth daily.  Marland Kitchen albuterol (PROVENTIL HFA;VENTOLIN HFA) 108 (90 BASE) MCG/ACT inhaler Inhale 2 puffs into the lungs every 4 (four) hours as needed for wheezing or shortness of breath.  . cephALEXin (KEFLEX) 500 MG capsule Take 1 capsule (500 mg total) by mouth 4 (four) times daily. 3 more days from 03/08/14  . levothyroxine (SYNTHROID, LEVOTHROID) 100 MCG tablet Take 100 mcg by mouth daily before breakfast.  . [DISCONTINUED] acetaminophen-codeine (TYLENOL #4) 300-60 MG per tablet Take 2 tablets by mouth 4 (four) times daily as needed for moderate pain. Take 2 tablets by mouth every 6 hours as needed for pain    Recent Results (from the past 2160 hour(s))  CBC WITH DIFFERENTIAL     Status: Abnormal   Collection Time    03/06/14  3:52 PM      Result Value Ref Range   WBC 7.7  4.0 - 10.5 K/uL   RBC 4.11  3.87 - 5.11 MIL/uL   Hemoglobin 13.9  12.0 - 15.0 g/dL   HCT 40.0  36.0 - 46.0 %   MCV 97.3  78.0 - 100.0 fL   MCH 33.8  26.0 - 34.0 pg   MCHC 34.8  30.0 - 36.0 g/dL   RDW 13.2  11.5 - 15.5 %   Platelets 247  150 - 400  K/uL   Neutrophils Relative % 81 (*) 43 - 77 %   Neutro Abs 6.3  1.7 - 7.7 K/uL   Lymphocytes Relative 11 (*) 12 - 46 %   Lymphs Abs 0.8  0.7 - 4.0 K/uL   Monocytes Relative 8  3 - 12 %   Monocytes Absolute 0.6  0.1 - 1.0 K/uL   Eosinophils Relative 0  0 - 5 %   Eosinophils Absolute 0.0  0.0 - 0.7 K/uL   Basophils Relative 0  0 - 1 %   Basophils Absolute 0.0  0.0 - 0.1 K/uL  I-STAT CHEM 8, ED     Status: Abnormal   Collection Time    03/06/14  4:25 PM      Result Value Ref Range   Sodium 130 (*) 137 - 147 mEq/L   Potassium 3.5 (*) 3.7 - 5.3 mEq/L   Chloride 96  96 - 112 mEq/L   BUN 9  6 - 23 mg/dL   Creatinine, Ser 0.90  0.50 - 1.10 mg/dL   Glucose, Bld 104 (*) 70 - 99 mg/dL   Calcium, Ion 1.12 (*) 1.13 - 1.30 mmol/L   TCO2 18  0 - 100 mmol/L   Hemoglobin 15.3 (*) 12.0 - 15.0 g/dL   HCT 45.0  36.0 - 46.0 %   URINALYSIS, ROUTINE W REFLEX MICROSCOPIC     Status: Abnormal   Collection Time    03/06/14  6:03 PM      Result Value Ref Range   Color, Urine YELLOW  YELLOW   APPearance TURBID (*) CLEAR   Specific Gravity, Urine 1.016  1.005 - 1.030   pH 6.5  5.0 - 8.0   Glucose, UA NEGATIVE  NEGATIVE mg/dL   Hgb urine dipstick TRACE (*) NEGATIVE   Bilirubin Urine NEGATIVE  NEGATIVE   Ketones, ur 40 (*) NEGATIVE mg/dL   Protein, ur NEGATIVE  NEGATIVE mg/dL   Urobilinogen, UA 0.2  0.0 - 1.0 mg/dL   Nitrite POSITIVE (*) NEGATIVE   Leukocytes, UA NEGATIVE  NEGATIVE  URINE RAPID DRUG SCREEN (HOSP PERFORMED)     Status: Abnormal   Collection Time    03/06/14  6:03 PM      Result Value Ref Range   Opiates NONE DETECTED  NONE DETECTED   Cocaine NONE DETECTED  NONE DETECTED   Benzodiazepines POSITIVE (*) NONE DETECTED   Amphetamines NONE DETECTED  NONE DETECTED   Tetrahydrocannabinol NONE DETECTED  NONE DETECTED   Barbiturates NONE DETECTED  NONE DETECTED   Comment:            DRUG SCREEN FOR MEDICAL PURPOSES     ONLY.  IF CONFIRMATION IS NEEDED     FOR ANY PURPOSE, NOTIFY LAB     WITHIN 5 DAYS.                LOWEST DETECTABLE LIMITS     FOR URINE DRUG SCREEN     Drug Class       Cutoff (ng/mL)     Amphetamine      1000     Barbiturate      200     Benzodiazepine   902     Tricyclics       409     Opiates          300     Cocaine          300     THC  50  URINE MICROSCOPIC-ADD ON     Status: Abnormal   Collection Time    03/06/14  6:03 PM      Result Value Ref Range   Squamous Epithelial / LPF MANY (*) RARE   RBC / HPF 0-2  <3 RBC/hpf   Bacteria, UA MANY (*) RARE   Urine-Other AMORPHOUS URATES/PHOSPHATES    URINE CULTURE     Status: None   Collection Time    03/06/14  6:03 PM      Result Value Ref Range   Specimen Description URINE, RANDOM     Special Requests ADDED [id number]     Culture  Setup Time       Value: 03/07/2014 08:33     Performed at [employer] [facility]       Value: >=100,000 COLONIES/ML     Performed at [facility]   Culture       Value: KLEBSIELLA PNEUMONIAE     Performed at [facility]   Report Status 03/09/2014 FINAL     Organism ID, Bacteria KLEBSIELLA PNEUMONIAE    BASIC METABOLIC PANEL     Status: Abnormal   Collection Time    03/07/14  6:01 AM      Result Value Ref Range   Sodium 142  137 - 147 mEq/L   Potassium 3.2 (*) 3.7 - 5.3 mEq/L   Chloride 104  96 - 112 mEq/L   CO2 18 (*) 19 - 32 mEq/L   Glucose, Bld 80  70 - 99 mg/dL   BUN 6  6 - 23 mg/dL   Creatinine, Ser 0.75  0.50 - 1.10 mg/dL   Calcium 8.3 (*) 8.4 - 10.5 mg/dL   GFR calc non Af Amer 89 (*) >90 mL/min   GFR calc Af Amer >90  >90 mL/min   Comment: (NOTE)     The eGFR has been calculated using the CKD EPI equation.     This calculation has not been validated in all clinical situations.     eGFR's persistently <90 mL/min signify possible Chronic Kidney     Disease.   Anion gap 20 (*) 5 - 15  CBC     Status: Abnormal   Collection Time    03/07/14  6:01 AM      Result Value Ref Range   WBC 5.4  4.0 - 10.5 K/uL   RBC 3.90  3.87 - 5.11 MIL/uL   Hemoglobin 13.2  12.0 - 15.0 g/dL   HCT 39.5  36.0 - 46.0 %   MCV 101.3 (*) 78.0 - 100.0 fL   MCH 33.8  26.0 - 34.0 pg   MCHC 33.4  30.0 - 36.0 g/dL   RDW 13.6  11.5 - 15.5 %   Platelets 212  150 - 400 K/uL      Constitutional:  BP 125/91  Pulse 100  Ht 5' 6.5" (1.689 m)  Wt 151 lb (68.493 kg)  BMI 24.01 kg/m2   Mental Status Examination;  Patient is casually dressed and fairly groomed.  She maintains fair eye contact.  Initially she was guarded and minimizes her history.  She described her mood anxious and depressed.  Her affect is constricted.  She denies any auditory or visual hallucination.  She denies any active or passive suicidal thoughts or homicidal thoughts.  There were no delusions, paranoia or any obsessive thoughts.  Her speech is fast and fluent.  There  are no flight of ideas  or any loose association.  Her fund of knowledge is average.  Her attention and concentration is distracted.  Her thought process is circumstantial.  She has no tremors or shakes.  Her psychomotor activity is slightly increased.  She is alert and oriented x3.  Her insight judgment and impulse control is fair.   Established Problem, Stable/Improving (1), New problem, with additional work up planned, Review of Psycho-Social Stressors (1), Review or order clinical lab tests (1), Decision to obtain old records (1), Review and summation of old records (2), Established Problem, Worsening (2), Review of Medication Regimen & Side Effects (2) and Review of New Medication or Change in Dosage (2)  Assessment: Axis I: Bipolar disorder1, anxiety disorder  Axis II: Deferred  Axis III:  Past Medical History  Diagnosis Date  . Asthma   . Depression   . GERD (gastroesophageal reflux disease)   . Inflammatory polyps of colon     pt notes flat and is to have surgery to remove   . Osteoarthritis of both knees   . Hypothyroidism   . H/O hiatal hernia   . Seizure 03/06/2014    tonic clonic seizure which lasted about a minute  . Anxiety   . Bipolar disorder   . 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  I called her physician assistant Veleta Miners at [phone], she mentioned that she has given Paxil, Seroquel, Wellbutrin refills and she also given Klonopin 0.5 mg 3 times a day however she only given 14 days supply.  I had a long discussion with the patient about her medication, psychiatric illness.  Recommended to try Klonopin 0.5 mg twice a day.  She is taking 2 antidepressants and given the fact that she has seizures due to withdrawal off Klonopin, I recommended to stop the Wellbutrin.  Recommended to increase Paxil 20 mg and take Vistaril 50 mg twice a day but she  has taken in the past with good response.  Continue Seroquel 300 mg 2 tablets at bedtime.  We will consider adjusting to doze of psychotropic medication on her next visit.  Discussed in detail polypharmacy, benzodiazepine dependence, tolerance individuals.  I also strongly suggest to see counseling for coping and social skills.  Plan discuss with the sister who agreed.  I will see her again in 2 weeks. Time spent 55 minutes.  More than 50% of the time spent in psychoeducation, counseling and coordination of care.  Discuss safety plan that anytime having active suicidal thoughts or homicidal thoughts then patient need to call 911 or go to the local emergency room.    Homar Weinkauf T., MD 05/17/2014

## 2014-05-31 ENCOUNTER — Encounter (HOSPITAL_COMMUNITY): Payer: Self-pay | Admitting: Psychiatry

## 2014-05-31 ENCOUNTER — Ambulatory Visit (INDEPENDENT_AMBULATORY_CARE_PROVIDER_SITE_OTHER): Payer: 59 | Admitting: Psychiatry

## 2014-05-31 VITALS — BP 121/96 | HR 101 | Ht 66.5 in | Wt 152.4 lb

## 2014-05-31 DIAGNOSIS — F3132 Bipolar disorder, current episode depressed, moderate: Secondary | ICD-10-CM

## 2014-05-31 DIAGNOSIS — F419 Anxiety disorder, unspecified: Secondary | ICD-10-CM

## 2014-05-31 DIAGNOSIS — F319 Bipolar disorder, unspecified: Secondary | ICD-10-CM

## 2014-05-31 MED ORDER — PAROXETINE HCL 40 MG PO TABS: 40.0000 mg | ORAL_TABLET | Freq: Every day | ORAL | Status: DC

## 2014-05-31 MED ORDER — QUETIAPINE FUMARATE 300 MG PO TABS
600.0000 mg | ORAL_TABLET | Freq: Every day | ORAL | Status: DC
Start: 2014-05-31 — End: 2014-05-31

## 2014-05-31 MED ORDER — HYDROXYZINE PAMOATE 50 MG PO CAPS: 50.0000 mg | ORAL_CAPSULE | ORAL | Status: DC | PRN

## 2014-05-31 MED ORDER — CLONAZEPAM 0.5 MG PO TABS: 0.5000 mg | ORAL_TABLET | Freq: Two times a day (BID) | ORAL | Status: DC

## 2014-05-31 MED ORDER — QUETIAPINE FUMARATE 300 MG PO TABS: 600.0000 mg | ORAL_TABLET | Freq: Every day | ORAL | Status: DC

## 2014-05-31 NOTE — Progress Notes (Addendum)
[place] [facility] [phone] Progress Note   [name] [id number] 63 y.o.  05/31/2014 9:14 AM  Chief Complaint:  I am feeling better but I still have a lot of anxiety and nervousness.    History of Present Illness:  Patient is a 63 year old Caucasian unemployed divorced female who is referred from her primary care physician.  She was seen for the first time 2 weeks ago because of bipolar disorder and panic attack.  She is taking Seroquel 600 mg daily.  We have increased Paxil 20 mg and given Klonopin 0.5 mg twice a day.  Her primary care physician refused to give Klonopin because she was not comfortable.  Patient had seizures due to withdrawal.  The patient showed some improvement with increase Paxil.  She is less anxious and less depressed.  She is to have irritability anger and mood swings however she feels improvement from the past.  She was given Vistaril which she is taking only as needed to help anxiety.  Patient denies any paranoia, hallucination, active of passive suicidal thoughts.  She endorses nervousness but denies any major panic attack.  She also denies any seizures.  She is wondering if Klonopin can be increased further.  Patient is not drinking or using any substances.  Her appetite is okay.  Her mother is stable.  Her friend who accompanied today mentioned that she has a lot of issues with her sister.  Patient admitted family issues especially that the sister.  She is scheduled to see Archie Endo for counseling and she has appointment on October 29.  Suicidal Ideation: No Plan Formed: No Patient has means to carry out plan: No  Homicidal Ideation: No Plan Formed: No Patient has means to carry out plan: No   Past Psychiatric History/Hospitalization(s) Patient endorsed history of bipolar disorder and panic attack.  She remembered having severe mood swings, anger, impulsive behavior, severe depression and then manic episodes.  She is taking Seroquel for many years.  She  had tried cholesterol in the past but do not remember the details.  The patient denies any history of psychiatric inpatient treatment other than she has seizures after stopping Klonopin.  She was seen physician assistant in this office many years ago and then she decided to see primary care physician for medication management.  In the beginning she saw Dr. Arelia Sneddon however it was switched to Dr. Rex Kras.   Anxiety: Yes Bipolar Disorder: Yes Depression: Yes Mania: Yes Psychosis: No Schizophrenia: No Personality Disorder: No Hospitalization for psychiatric illness: Patient was admitted because of seizures due to stopping Klonopin. History of Electroconvulsive Shock Therapy: No Prior Suicide Attempts: No  Medical History; The patient has GERD, osteoarthritis, hypothyroidism, polyps.  Her primary care physician is [facility].  She is seeing a physician assistant Veleta Miners.  Psychosocial History; Patient born and raised in [place].  She has been married 3 times.  She has 2 children who live out of state.  She has sister however she does not get along with her.  She lives by herself.  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 albuterol (PROVENTIL HFA;VENTOLIN HFA) 108 (90 BASE) MCG/ACT inhaler Inhale 2 puffs into the lungs every 4 (four) hours as needed for wheezing or shortness of breath.  . cephALEXin (KEFLEX) 500 MG capsule Take 1 capsule (500 mg total) by mouth  4 (four) times daily. 3 more days from 03/08/14  . clonazePAM (KLONOPIN) 0.5 MG tablet Take 1 tablet (0.5 mg total) by mouth 2 (two) times daily.  . hydrOXYzine (VISTARIL) 50 MG capsule Take 1 capsule (50 mg total) by mouth as needed for anxiety. For nerves  . levothyroxine (SYNTHROID, LEVOTHROID) 100 MCG tablet Take 100 mcg by mouth daily before breakfast.  . PARoxetine (PAXIL) 40 MG tablet Take 1 tablet (40 mg total) by mouth daily.  . QUEtiapine (SEROQUEL) 300 MG tablet Take 2 tablets (600 mg total) by mouth at bedtime.  . [DISCONTINUED] clonazePAM (KLONOPIN) 0.5 MG tablet Take 1 tablet (0.5 mg total) by mouth 2 (two) times daily.  . [DISCONTINUED] hydrOXYzine (VISTARIL) 50 MG capsule Take 1 capsule (50 mg total) by mouth 2 (two) times daily. For nerves  . [DISCONTINUED] hydrOXYzine (VISTARIL) 50 MG capsule Take 1 capsule (50 mg total) by mouth as needed for anxiety. For nerves  . [DISCONTINUED] PARoxetine (PAXIL) 20 MG tablet Take 1 tablet (20 mg total) by mouth daily.  . [DISCONTINUED] PARoxetine (PAXIL) 40 MG tablet Take 1 tablet (40 mg total) by mouth daily.  . [DISCONTINUED] QUEtiapine (SEROQUEL) 300 MG tablet Take 2 tablets (600 mg total) by mouth at bedtime.  . [DISCONTINUED] QUEtiapine (SEROQUEL) 400 MG tablet Take 600 mg by mouth at bedtime.    Recent Results (from the past 2160 hour(s))  CBC WITH DIFFERENTIAL     Status: Abnormal   Collection Time    03/06/14  3:52 PM      Result Value Ref Range   WBC 7.7  4.0 - 10.5 K/uL   RBC 4.11  3.87 - 5.11 MIL/uL   Hemoglobin 13.9  12.0 - 15.0 g/dL   HCT 40.0  36.0 - 46.0 %   MCV 97.3  78.0 - 100.0 fL   MCH 33.8  26.0 - 34.0 pg   MCHC 34.8  30.0 - 36.0 g/dL   RDW 13.2  11.5 - 15.5 %   Platelets 247  150 - 400 K/uL   Neutrophils Relative % 81 (*) 43 - 77 %   Neutro Abs 6.3  1.7 - 7.7 K/uL   Lymphocytes Relative 11 (*) 12 - 46 %   Lymphs Abs 0.8  0.7 - 4.0 K/uL   Monocytes Relative 8  3 - 12 %   Monocytes Absolute 0.6  0.1 - 1.0 K/uL    Eosinophils Relative 0  0 - 5 %   Eosinophils Absolute 0.0  0.0 - 0.7 K/uL   Basophils Relative 0  0 - 1 %   Basophils Absolute 0.0  0.0 - 0.1 K/uL  I-STAT CHEM 8, ED     Status: Abnormal   Collection Time    03/06/14  4:25 PM      Result Value Ref Range   Sodium 130 (*) 137 - 147 mEq/L   Potassium 3.5 (*) 3.7 - 5.3 mEq/L   Chloride 96  96 - 112 mEq/L   BUN 9  6 - 23 mg/dL   Creatinine, Ser 0.90  0.50 - 1.10 mg/dL   Glucose, Bld 104 (*) 70 - 99 mg/dL   Calcium, Ion 1.12 (*) 1.13 - 1.30 mmol/L   TCO2 18  0 - 100 mmol/L   Hemoglobin 15.3 (*) 12.0 - 15.0 g/dL   HCT 45.0  36.0 - 46.0 %  URINALYSIS, ROUTINE W REFLEX MICROSCOPIC     Status: Abnormal   Collection Time    03/06/14  6:03 PM      Result Value Ref Range   Color, Urine YELLOW  YELLOW   APPearance TURBID (*) CLEAR   Specific Gravity, Urine 1.016  1.005 - 1.030   pH 6.5  5.0 - 8.0   Glucose, UA NEGATIVE  NEGATIVE mg/dL   Hgb urine dipstick TRACE (*) NEGATIVE   Bilirubin Urine NEGATIVE  NEGATIVE   Ketones, ur 40 (*) NEGATIVE mg/dL   Protein, ur NEGATIVE  NEGATIVE mg/dL   Urobilinogen, UA 0.2  0.0 - 1.0 mg/dL   Nitrite POSITIVE (*) NEGATIVE   Leukocytes, UA NEGATIVE  NEGATIVE  URINE RAPID DRUG SCREEN (HOSP PERFORMED)     Status: Abnormal   Collection Time    03/06/14  6:03 PM      Result Value Ref Range   Opiates NONE DETECTED  NONE DETECTED   Cocaine NONE DETECTED  NONE DETECTED   Benzodiazepines POSITIVE (*) NONE DETECTED   Amphetamines NONE DETECTED  NONE DETECTED   Tetrahydrocannabinol NONE DETECTED  NONE DETECTED   Barbiturates NONE DETECTED  NONE DETECTED   Comment:            DRUG SCREEN FOR MEDICAL PURPOSES     ONLY.  IF CONFIRMATION IS NEEDED     FOR ANY PURPOSE, NOTIFY LAB     WITHIN 5 DAYS.                LOWEST DETECTABLE LIMITS     FOR URINE DRUG SCREEN     Drug Class       Cutoff (ng/mL)     Amphetamine      1000     Barbiturate      200     Benzodiazepine   295     Tricyclics       188      Opiates          300     Cocaine          300     THC              50  URINE MICROSCOPIC-ADD ON     Status: Abnormal   Collection Time    03/06/14  6:03 PM      Result Value Ref Range   Squamous Epithelial / LPF MANY (*) RARE   RBC / HPF 0-2  <3 RBC/hpf   Bacteria, UA MANY (*) RARE   Urine-Other AMORPHOUS URATES/PHOSPHATES    URINE CULTURE     Status: None   Collection Time    03/06/14  6:03 PM      Result Value Ref Range   Specimen Description URINE, RANDOM     Special Requests ADDED [id number]     Culture  Setup Time       Value: 03/07/2014 08:33     Performed at [facility] Count       Value: >=100,000 COLONIES/ML     Performed at [facility]   Culture       Value: KLEBSIELLA PNEUMONIAE     Performed at [facility]   Report Status 03/09/2014 FINAL     Organism ID, Bacteria KLEBSIELLA PNEUMONIAE    BASIC METABOLIC PANEL  Status: Abnormal   Collection Time    03/07/14  6:01 AM      Result Value Ref Range   Sodium 142  137 - 147 mEq/L   Potassium 3.2 (*) 3.7 - 5.3 mEq/L   Chloride 104  96 - 112 mEq/L   CO2 18 (*) 19 - 32 mEq/L   Glucose, Bld 80  70 - 99 mg/dL   BUN 6  6 - 23 mg/dL   Creatinine, Ser 0.75  0.50 - 1.10 mg/dL   Calcium 8.3 (*) 8.4 - 10.5 mg/dL   GFR calc non Af Amer 89 (*) >90 mL/min   GFR calc Af Amer >90  >90 mL/min   Comment: (NOTE)     The eGFR has been calculated using the CKD EPI equation.     This calculation has not been validated in all clinical situations.     eGFR's persistently <90 mL/min signify possible Chronic Kidney     Disease.   Anion gap 20 (*) 5 - 15  CBC     Status: Abnormal   Collection Time    03/07/14  6:01 AM      Result Value Ref Range   WBC 5.4  4.0 - 10.5 K/uL   RBC 3.90  3.87 - 5.11 MIL/uL   Hemoglobin 13.2  12.0 - 15.0 g/dL   HCT 39.5  36.0 - 46.0 %   MCV 101.3 (*) 78.0 - 100.0 fL   MCH 33.8  26.0 - 34.0 pg   MCHC 33.4  30.0 - 36.0 g/dL   RDW 13.6  11.5 - 15.5 %   Platelets  212  150 - 400 K/uL      Constitutional:  BP 121/96  Pulse 101  Ht 5' 6.5" (1.689 m)  Wt 152 lb 6.4 oz (69.128 kg)  BMI 24.23 kg/m2   Mental Status Examination;  Patient is casually dressed and fairly groomed.  She maintains fair eye contact.  She described her mood anxious and depressed.  Her affect is constricted.  She denies any auditory or visual hallucination.  She denies any active or passive suicidal thoughts or homicidal thoughts.  There were no delusions, paranoia or any obsessive thoughts.  Her speech is fast and fluent.  There are no flight of ideas or any loose association.  Her fund of knowledge is average.  Her attention and concentration is distracted.  Her thought process is circumstantial.  She has no tremors or shakes.  Her psychomotor activity is slightly increased.  She is alert and oriented x3.  Her insight judgment and impulse control is fair.   Established Problem, Stable/Improving (1), Review of Psycho-Social Stressors (1), Review of Last Therapy Session (1), Review of Medication Regimen & Side Effects (2) and Review of New Medication or Change in Dosage (2)  Assessment: Axis I: Bipolar disorder1, anxiety disorder  Axis II: Deferred  Axis III:  Past Medical History  Diagnosis Date  . Asthma   . Depression   . GERD (gastroesophageal reflux disease)   . Inflammatory polyps of colon     pt notes flat and is to have surgery to remove   . Osteoarthritis of both knees   . Hypothyroidism   . H/O hiatal hernia   . Seizure 03/06/2014    tonic clonic seizure which lasted about a minute  . Anxiety   . Bipolar disorder   . 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  She is taking Seroquel 600 mg at bedtime.  I recommended this day Klonopin 0.5 mg twice a day however I would increase Paxil 40 mg to help anxiety and nervousness.  Recommended to take Vistaril at bedtime if it  caused sedation.  Reinforced to keep appointment with therapist for counseling.  Discussed medication side effects in detail.  At this time he does not have any concerns or any side effects.  Discussed polypharmacy and benzodiazepine dependence and withdrawal.  I will see her again in 2 months.  Recommended to call us back if she has any question or any concern. Time spent 25 minutes.  More than 50% of the time spent in psychoeducation, counseling and coordination of care.  Discuss safety plan that anytime having active suicidal thoughts or homicidal thoughts then patient need to call 911 or go to the local emergency room.    ARFEEN,SYED T., MD 05/31/2014

## 2014-06-07 ENCOUNTER — Ambulatory Visit (HOSPITAL_COMMUNITY): Payer: Self-pay | Admitting: Psychology

## 2014-07-13 ENCOUNTER — Other Ambulatory Visit (HOSPITAL_COMMUNITY): Payer: Self-pay | Admitting: Psychiatry

## 2014-07-16 ENCOUNTER — Other Ambulatory Visit (HOSPITAL_COMMUNITY): Payer: Self-pay | Admitting: Psychiatry

## 2014-07-17 ENCOUNTER — Encounter (HOSPITAL_COMMUNITY): Payer: Self-pay | Admitting: Psychiatry

## 2014-07-17 ENCOUNTER — Ambulatory Visit (INDEPENDENT_AMBULATORY_CARE_PROVIDER_SITE_OTHER): Payer: 59 | Admitting: Psychiatry

## 2014-07-17 VITALS — BP 122/88 | HR 103 | Wt 147.0 lb

## 2014-07-17 DIAGNOSIS — F3132 Bipolar disorder, current episode depressed, moderate: Secondary | ICD-10-CM

## 2014-07-17 DIAGNOSIS — F419 Anxiety disorder, unspecified: Secondary | ICD-10-CM

## 2014-07-17 DIAGNOSIS — F319 Bipolar disorder, unspecified: Secondary | ICD-10-CM

## 2014-07-17 MED ORDER — CLONAZEPAM 0.5 MG PO TABS: 0.5000 mg | ORAL_TABLET | Freq: Two times a day (BID) | ORAL | Status: DC

## 2014-07-17 MED ORDER — HYDROXYZINE PAMOATE 50 MG PO CAPS: 50.0000 mg | ORAL_CAPSULE | ORAL | Status: DC | PRN

## 2014-07-17 MED ORDER — QUETIAPINE FUMARATE 300 MG PO TABS: 600.0000 mg | ORAL_TABLET | Freq: Every day | ORAL | Status: DC

## 2014-07-17 MED ORDER — PAROXETINE HCL 40 MG PO TABS: 40.0000 mg | ORAL_TABLET | Freq: Every day | ORAL | Status: DC

## 2014-07-17 NOTE — Progress Notes (Signed)
[facility] [id number] Progress Note   [name] [id number] 63 y.o.  07/17/2014 8:44 AM  Chief Complaint:  Medication management and follow-up.  History of Present Illness:  Brittany JonesCarolyn came for her follow-up appointment.  On her last visit we increased Paxil 60 mg and recommended to take Klonopin 0.5 mg twice a day.  She is less anxious and less nervous.  She denies any major panic attack.  She sleeping better.  She is taking Vistaril only as needed however she continues to take Seroquel 600 mg every night.  She denies any agitation, anger, mood swing.  She denies any paranoia or any delusions.  She denies any recent crying spells.  She continues to have a lot of family issues and she is not happy because her sister did not invite her on Thanksgiving.  She missed appointment with a therapist but reschedule on December 11.  Patient denies drinking or using any illegal substances.  Her appetite is okay.  Her vitals are stable.  Patient has 2 children who lives out of state .  Patient lives by herself.    Suicidal Ideation: No Plan Formed: No Patient has means to carry out plan: No  Homicidal Ideation: No Plan Formed: No Patient has means to carry out plan: No  Past Psychiatric History/Hospitalization(s) Patient has history of bipolar disorder with severe mood swings, anger, impulsive behavior , mania and severe depression.  She had a seizures when her Klonopin was discontinued by her primary care physician.  She has seen in this office by physician assistant for many years and then she decided to see her primary care physician for medication management.  She has no inpatient psychiatric treatment.     Anxiety: Yes Bipolar Disorder: Yes Depression: Yes Mania: Yes Psychosis: No Schizophrenia: No Personality Disorder: No Hospitalization for psychiatric illness: Patient was admitted because of seizures due to stopping Klonopin. History of Electroconvulsive Shock Therapy: No Prior  Suicide Attempts: No  Medical History; The patient has GERD, osteoarthritis, hypothyroidism, polyps.  [facility].  She is seeing a physician assistant Benson NorwayBrittney [name].  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 albuterol (PROVENTIL HFA;VENTOLIN HFA) 108 (90 BASE) MCG/ACT inhaler Inhale 2 puffs into the lungs every 4 (four) hours as needed for wheezing or shortness of breath.  . clonazePAM (KLONOPIN) 0.5 MG tablet Take 1 tablet (0.5 mg total) by mouth 2 (two) times daily.  . hydrOXYzine (VISTARIL) 50 MG capsule Take 1 capsule (50 mg total) by mouth as needed for anxiety. For nerves  . levothyroxine (SYNTHROID, LEVOTHROID) 100 MCG tablet Take 100 mcg by mouth daily before breakfast.  . PARoxetine (PAXIL) 40 MG tablet Take 1 tablet (40 mg total) by mouth daily.  . QUEtiapine (SEROQUEL) 300 MG tablet Take 2 tablets (600 mg total) by mouth at bedtime.  . [DISCONTINUED] cephALEXin (KEFLEX) 500 MG capsule Take 1 capsule (500 mg total) by mouth 4 (four) times daily. 3 more days from 03/08/14  . [DISCONTINUED] clonazePAM (KLONOPIN) 0.5 MG tablet Take 1 tablet (0.5 mg total) by mouth  2 (two) times daily.  . [DISCONTINUED] hydrOXYzine (VISTARIL) 50 MG capsule Take 1 capsule (50 mg total) by mouth as needed for anxiety. For nerves  . [DISCONTINUED] PARoxetine (PAXIL) 40 MG tablet Take 1 tablet (40 mg total) by mouth daily.  . [DISCONTINUED] QUEtiapine  (SEROQUEL) 300 MG tablet Take 2 tablets (600 mg total) by mouth at bedtime.    No results found for this or any previous visit (from the past 2160 hour(s)).    Constitutional:  BP 122/88 mmHg  Pulse 103  Wt 147 lb (66.679 kg)   Mental Status Examination;  Patient is casually dressed and fairly groomed.  She maintains fair eye contact.  She described her mood anxious and and her affect is mood appropriate.  She denies any auditory or visual hallucination.  She denies any active or passive suicidal thoughts or homicidal thoughts.  There were no delusions, paranoia or any obsessive thoughts.  Her speech is fast and fluent.  There are no flight of ideas or any loose association.  Her fund of knowledge is average.  Her attention and concentration is fair. Her thought process is circumstantial.  She has no tremors or shakes.  Her psychomotor activity is slightly increased.  She is alert and oriented x3.  Her insight judgment and impulse control is fair.   Established Problem, Stable/Improving (1), Review of Psycho-Social Stressors (1), Review of Last Therapy Session (1) and Review of Medication Regimen & Side Effects (2)  Assessment: Axis I: Bipolar disorder1, anxiety disorder  Axis II: Deferred  Axis III:  Past Medical History  Diagnosis Date  . Asthma   . Depression   . GERD (gastroesophageal reflux disease)   . Inflammatory polyps of colon     pt notes flat and is to have surgery to remove   . Osteoarthritis of both knees   . Hypothyroidism   . H/O hiatal hernia   . Seizure 03/06/2014    tonic clonic seizure which lasted about a minute  . Anxiety   . Bipolar disorder   . 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  I will continue Klonopin 0.5 mg twice a day, Paxil 40 mg daily, Seroquel 600 mg at bedtime and Vistaril 50 mg as needed for severe anxiety and panic attack.   Encouraged to keep appointment with therapist for coping and social skills.  Discussed medication side effects and benzodiazepine dependence and withdrawal.  I will see her again in 2 months.  Recommended to call us back if she has any question or any concern.   Bellamy Judson T., MD 07/17/2014

## 2014-07-20 ENCOUNTER — Ambulatory Visit (HOSPITAL_COMMUNITY): Payer: Self-pay | Admitting: Psychology

## 2014-07-26 ENCOUNTER — Ambulatory Visit (INDEPENDENT_AMBULATORY_CARE_PROVIDER_SITE_OTHER): Payer: 59 | Admitting: Psychology

## 2014-07-26 ENCOUNTER — Encounter (HOSPITAL_COMMUNITY): Payer: Self-pay | Admitting: Psychology

## 2014-07-26 DIAGNOSIS — F3132 Bipolar disorder, current episode depressed, moderate: Secondary | ICD-10-CM

## 2014-07-26 NOTE — Progress Notes (Signed)
[name] is a 63 y.o. female patient referred to counseling by Dr. Lolly Mustache who is treating pt for Bipolar 1 D/O.   Patient:   [name]   DOB:   17-Aug-1950  MR Number:  [id number]  Location:  [facility] [place] [id number] [place] [address] Dept: [phone]           Date of Service:   07/26/14  Start Time:   9.50am End Time:   10.27am  Provider/Observer:  [facility]       Billing Code/Service: [phone]  Chief Complaint:     Chief Complaint  Patient presents with  . Establish Care  . Stress    Reason for Service:  Pt reports she is here today as needs to f/u w/ referral by Dr. Lolly Mustache so she can continued getting her medicine.  Pt states she doesn't "feel well today"- complaining of hernia and sinus problems- and wants to leave session as soon as possible. Pt reports that she has dealt w/ a lot in the past year.  Pt reported she grieves the death of her oldest brother- who died in 2013-03-26.  Pt reports this has been more difficult as it is the holidays.  Pt reports she also just broke up w/ boyfriend this week and they have been dating on and off for 3 years.  Pt reported that she experienced seizures in August 17, 2015after her PCP refuses to refill her Klonopin stating. Pt reported she ended up in the hospital and then rehab for a week as didn't really know who she was or what was going on.  Pt states she has no memory of this.  Pt reports that has strained relationship w/ sister and they haven't talked in a couple of months.  Pt reported that sister was trying to control her medication and belittling her- they got into an argument and sister stated she was "done w/ her".    Current Status:  Pt reports that this week she hasn't been "feeling good".  Pt isn't able to identify her mood but just states been hard week.  Pt reports she is sleeping well w/ her Seroquel.  Pt reports she is  taking her medications as prescribed- except for Vistaril as "doesn't like that medication'.    Reliability of Information: Pt is seen individually.  Pt records from Dr. Lolly Mustache reviewed.   Behavioral Observation: [name]  presents as a 63 y.o.-year-old Caucasian Female who appeared her stated age. her dress was Appropriate and she was Well Groomed and her manners were Appropriate to the situation.  There were not any physical disabilities noted.  she displayed an fair level of cooperation and motivation.    Interactions:    Active- but clear that she wanted to end session early as wasn't feeling well  Attention:   within normal limits  Memory:   abnormal - Pt had difficult giving hx of when parents decesased.    Visuo-spatial:   not examined  Speech (Volume):  normal  Speech:   normal pitch and normal volume  Thought Process:  Tangential  Though Content:  WNL  Orientation:   person, place, time/date and situation  Judgment:   Fair  Planning:   Fair  Affect:    Blunted  Mood:    "not feeling well"  Insight:   Fair  Intelligence:   normal  Marital Status/Living: Pt is divorced female who lives alone.  Pt has 2 daughters who are married- one lives in state; the other in [place].  Pt reports relationship w/ 63 y/o daughter is very strained- almost no contact- she has 2 granddaughter by this daughter.  Pt reports that relationship w/ 37y/o daughter is also strained- but they remain in contact- but they haven't visited in almost 3 years.  Pt has 2 grandsons and 1 granddaughter by this daughter. Pt has been married 3 times.  Pt just recently separated from boyfriend that she reports has been in on and off again relationship for past 3 years.  Pt was the youngest of 6 kids- 4 brothers and 1 sister.  Pt parents are both deceased and oldest brother is deceased.  Pt reports strained relationship w/ sister- but relationship ok w/ brothers.  Pt reports she has friends who are supports.     Strengths:   Pt identifies friends as supports.  Pt reports kept good relationship w/ ex husband.  Pt reports that she is independent.   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 .  Pt denies any abuse of drugs or alcohol.   Education:   HS Graduate  Medical History:   Past Medical History  Diagnosis Date  . Asthma   . Depression   . GERD (gastroesophageal reflux disease)   . Inflammatory polyps of colon     pt notes flat and is to have surgery to remove   . Osteoarthritis of both knees   . Hypothyroidism   . H/O hiatal hernia   . Seizure 03/06/2014    tonic clonic seizure which lasted about a minute  . Anxiety   . Bipolar disorder   . Benzodiazepine withdrawal with delirium 03/06/2014        Outpatient Encounter Prescriptions as of 07/26/2014  Medication Sig  . clonazePAM (KLONOPIN) 0.5 MG tablet Take 1 tablet (0.5 mg total) by mouth 2 (two) times daily.  Marland Kitchen. levothyroxine (SYNTHROID, LEVOTHROID) 100 MCG tablet Take 100 mcg by mouth daily before breakfast.  . PARoxetine (PAXIL) 40 MG tablet Take 1 tablet (40 mg total) by mouth daily.  . QUEtiapine (SEROQUEL) 300 MG tablet Take 2 tablets (600 mg total) by mouth at bedtime.  Marland Kitchen. albuterol (PROVENTIL HFA;VENTOLIN HFA) 108 (90 BASE) MCG/ACT inhaler Inhale 2 puffs into the lungs every 4 (four) hours as needed for wheezing or shortness of breath.  . hydrOXYzine (VISTARIL) 50 MG capsule Take 1 capsule (50 mg total) by mouth as needed for anxiety. For nerves (Patient not taking: Reported on 07/26/2014)        Pt reports she doesn't take Vistaril- didn't like how made her feel.   Sexual History:   History  Sexual Activity  . Sexual Activity: Not Currently    Abuse/Trauma History: Pt denies any abuse or trauma.  Psychiatric History:  Pt reported been tx for mood disorder since  1989.  Pt reports that she worked w/ Jorje GuildAlan Watt, PA in the past and has begun working w/ [name] in 2015.  Pt denies any hx of counseling in past.   Family Med/Psych History:  Family History  Problem Relation Age of Onset  . Cancer Mother   . Cancer Brother   . Cancer Brother     Risk of Suicide/Violence: virtually non-existent Pt denies any SI or hx of attempts or harm to self.   Impression/DX:  Pt  is a 63 y/o female who presents as referred by Dr. Florentina JennyAfreen and is motivated to come to counseling to be able to continue her medication. Pt reports no hx of counseling although been tx for mood disorder since 1989.  Pt reported that past year signficant stressors w/ grieving brothers death, experiencing seizures from Benzodiazepine withdraw when PCP would no longer prescribe, conflict w/ sister and recent breakup w/ boyfriend.  Pt reports that she isn't feeling good today and wanted to end session early.  Pt limited w/ information given re: current symptoms, thought process tangential and memory fair.  Pt agrees for f/u stating " I could use someone to talk to and need my medications.    Disposition/Plan:  F/u in 1 month.  Pt to come w/ goals for counseling to develop tx plan.   Diagnosis:      Bipolar 1 disorder, depressed, moderate            Cherine Drumgoole, LPC

## 2014-07-31 ENCOUNTER — Encounter: Payer: Self-pay | Admitting: Internal Medicine

## 2014-08-13 ENCOUNTER — Emergency Department (HOSPITAL_COMMUNITY)
Admission: EM | Admit: 2014-08-13 | Discharge: 2014-08-13 | Disposition: A | Discharge status: Home / Self Care | Payer: Medicare Other | Attending: Emergency Medicine | Admitting: Emergency Medicine

## 2014-08-13 ENCOUNTER — Encounter (HOSPITAL_COMMUNITY): Payer: Self-pay | Admitting: Emergency Medicine

## 2014-08-13 ENCOUNTER — Other Ambulatory Visit (HOSPITAL_COMMUNITY): Payer: Self-pay | Admitting: Psychiatry

## 2014-08-13 DIAGNOSIS — Z8719 Personal history of other diseases of the digestive system: Secondary | ICD-10-CM | POA: Insufficient documentation

## 2014-08-13 DIAGNOSIS — J45909 Unspecified asthma, uncomplicated: Secondary | ICD-10-CM | POA: Diagnosis not present

## 2014-08-13 DIAGNOSIS — F419 Anxiety disorder, unspecified: Secondary | ICD-10-CM | POA: Insufficient documentation

## 2014-08-13 DIAGNOSIS — F319 Bipolar disorder, unspecified: Secondary | ICD-10-CM | POA: Diagnosis not present

## 2014-08-13 DIAGNOSIS — Z79899 Other long term (current) drug therapy: Secondary | ICD-10-CM | POA: Diagnosis not present

## 2014-08-13 DIAGNOSIS — Z760 Encounter for issue of repeat prescription: Secondary | ICD-10-CM | POA: Diagnosis not present

## 2014-08-13 DIAGNOSIS — Z720 Tobacco use: Secondary | ICD-10-CM | POA: Insufficient documentation

## 2014-08-13 DIAGNOSIS — G40309 Generalized idiopathic epilepsy and epileptic syndromes, not intractable, without status epilepticus: Secondary | ICD-10-CM | POA: Diagnosis not present

## 2014-08-13 DIAGNOSIS — Z86010 Personal history of colonic polyps: Secondary | ICD-10-CM | POA: Diagnosis not present

## 2014-08-13 DIAGNOSIS — E039 Hypothyroidism, unspecified: Secondary | ICD-10-CM | POA: Diagnosis not present

## 2014-08-13 DIAGNOSIS — Z8739 Personal history of other diseases of the musculoskeletal system and connective tissue: Secondary | ICD-10-CM | POA: Insufficient documentation

## 2014-08-13 NOTE — ED Notes (Signed)
Per patient states she is out of her klonopin-states she needs script-called MD and he wont be in office till tomorrow

## 2014-08-13 NOTE — ED Provider Notes (Signed)
CSN: [id number]     Arrival date & time 08/13/14  1224 History   First MD Initiated Contact with Patient 08/13/14 1301     Chief Complaint  Patient presents with  . needs script refilled      (Consider location/radiation/quality/duration/timing/severity/associated sxs/prior Treatment) The history is provided by the patient. No language interpreter was used.  [name] is a 64 y/o F with PMHx of depression, anxiety, seizure, bipolar disorder presenting to the ED with request for Klonopin refill. Patient reported that her last Klonopin was this morning at approximately 7:00 AM. Stated that she normally takes two 0.5 mg tablets at least 4 times per day for her anxiety and bipolar disorder. Patient reported that she called her primary care provider's office this morning, reported that her doctor was not in was not able to see her today. When asked when her last prescription was filled she said sometime in early December and that the physician has not prescribed any medications. Patient reported that she is concerned secondary to having a seizure secondary to withdrawals when she did not take her medications back in August 2015. Patient reported that she is concerned. Denied shortness breath, difficulty breathing, chest pain, fainting, nausea, vomiting, dizziness, headache, blurred vision, sudden loss of vision, neck pain, neck stiffness, increased depression, suicidal ideation, homicidal ideation. PCP Dr. Clarene Duke Psychiatrist Dr. Suella Broad  Past Medical History  Diagnosis Date  . Asthma   . Depression   . GERD (gastroesophageal reflux disease)   . Inflammatory polyps of colon     pt notes flat and is to have surgery to remove   . Osteoarthritis of both knees   . Hypothyroidism   . H/O hiatal hernia   . Seizure 03/06/2014    tonic clonic seizure which lasted about a minute  . Anxiety   . Bipolar disorder   . Benzodiazepine withdrawal with delirium 03/06/2014   Past Surgical History   Procedure Laterality Date  . Cataract extraction w/ intraocular lens  implant, bilateral Bilateral   . Rotator cuff repair Right   . Vaginal hysterectomy  01/2003    lap assisted w/BSO/notes 12/23/2010  . Tubal ligation     Family History  Problem Relation Age of Onset  . Cancer Mother   . Cancer Brother   . Cancer Brother    History  Substance Use Topics  . Smoking status: Current Every Day Smoker -- 1.00 packs/day for 44 years    Types: Cigarettes  . Smokeless tobacco: Never Used  . Alcohol Use: No   OB History    No data available     Review of Systems  Constitutional: Negative for fever and chills.  Respiratory: Negative for chest tightness and shortness of breath.   Cardiovascular: Negative for chest pain.  Neurological: Negative for dizziness and headaches.      Allergies  Review of patient's allergies indicates no known allergies.  Home Medications   Prior to Admission medications   Medication Sig Start Date End Date Taking? Authorizing Provider  albuterol (PROVENTIL HFA;VENTOLIN HFA) 108 (90 BASE) MCG/ACT inhaler Inhale 2 puffs into the lungs every 4 (four) hours as needed for wheezing or shortness of breath.    Historical Provider, MD  clonazePAM (KLONOPIN) 0.5 MG tablet Take 1 tablet (0.5 mg total) by mouth 2 (two) times daily. 07/17/14   Cleotis Nipper, MD  hydrOXYzine (VISTARIL) 50 MG capsule Take 1 capsule (50 mg total) by mouth as needed for anxiety. For nerves Patient not taking:  Reported on 07/26/2014 07/17/14   Cleotis Nipper, MD  levothyroxine (SYNTHROID, LEVOTHROID) 100 MCG tablet Take 100 mcg by mouth daily before breakfast.    Historical Provider, MD  PARoxetine (PAXIL) 40 MG tablet Take 1 tablet (40 mg total) by mouth daily. 07/17/14   Cleotis Nipper, MD  QUEtiapine (SEROQUEL) 300 MG tablet Take 2 tablets (600 mg total) by mouth at bedtime. 07/17/14   Cleotis Nipper, MD   BP 130/96 mmHg  Pulse 80  Temp(Src) 98.2 F (36.8 C) (Oral)  Resp 18  SpO2  100% Physical Exam  Constitutional: She is oriented to person, place, and time. She appears well-developed and well-nourished. No distress.  HENT:  Head: Normocephalic and atraumatic.  Mouth/Throat: Oropharynx is clear and moist. No oropharyngeal exudate.  Eyes: Conjunctivae and EOM are normal. Pupils are equal, round, and reactive to light. Right eye exhibits no discharge. Left eye exhibits no discharge.  Neck: Normal range of motion. Neck supple.  Cardiovascular: Normal rate, regular rhythm and normal heart sounds.  Exam reveals no friction rub.   No murmur heard. Pulmonary/Chest: Effort normal and breath sounds normal. No respiratory distress. She has no wheezes. She has no rales.  Musculoskeletal: Normal range of motion.  Lymphadenopathy:    She has no cervical adenopathy.  Neurological: She is alert and oriented to person, place, and time. No cranial nerve deficit. Coordination normal.  Skin: Skin is warm. She is not diaphoretic.  Psychiatric: Her behavior is normal. Thought content normal.  Flat affect   Nursing note and vitals reviewed.   ED Course  Procedures (including critical care time) Labs Review Labs Reviewed - No data to display  Imaging Review No results found.   EKG Interpretation None      1:48 PM Discussed case with Dr. Patria Mane, attending physician. Dr. Patria Mane saw and assessed patient. Dr. Patria Mane reported that he spoke with psychiatrist, Dr. Suella Broad, and stated that the patient actually has 2 refills, but the patient has not been taking the medications as prescribed. Reported that the patient continues to finish her prescriptions too early. Dr. Patria Mane reported the patient is to be discharged with no medications or prescriptions and that patient is to follow-up with primary care provider on Wednesday-appointment set up by Dr. Patria Mane for Wednesday morning, 08/15/2013.  MDM   Final diagnoses:  Medication refill    Medications - No data to display  Filed  Vitals:   08/13/14 1238  BP: 130/96  Pulse: 80  Temp: 98.2 F (36.8 C)  TempSrc: Oral  Resp: 18  SpO2: 100%   Patient presenting to the ED with request for medication refill of Klonopin. Patient reported that she's been taking the medication for approximately 10-15 years for anxiety. Patient reported that her last prescription was filled in early December-stated that she tried to call the office today but physician was not in. Patient seen and assessed by attending physician, Dr. Gretta Arab per physician reported that patient has 2 refills of this medication, but is not able to refill the medication secondary to the medication being refill too soon-patient is not taking medications as prescribed. Attending physician spoke with patient's prescribing physician who reported that patient will be seen in his office on Wednesday, 08/15/2013. As per attending physician, patient to be discharged with no medications or prescriptions. Patient stable, afebrile. Patient not septic appearing. Patient does not appear to harm to self or others. Discharged patient. Referred patient to PCP and to follow-up with primary care provider. Discussed with  patient to closely monitor symptoms and if symptoms are to worsen or change to report back to the ED - strict return instructions given.  Patient agreed to plan of care, understood, all questions answered.     Raymon Mutton, PA-C 08/13/14 1411  Raymon Mutton, PA-C 08/13/14 1419  Lyanne Co, MD 08/13/14 [phone]

## 2014-08-14 ENCOUNTER — Telehealth (HOSPITAL_COMMUNITY): Payer: Self-pay

## 2014-08-14 ENCOUNTER — Other Ambulatory Visit (HOSPITAL_COMMUNITY): Payer: Self-pay | Admitting: Psychiatry

## 2014-08-14 MED ORDER — LAMOTRIGINE 25 MG PO TABS: ORAL_TABLET | ORAL | Status: DC

## 2014-08-14 NOTE — Telephone Encounter (Signed)
I returned patient's phone call who is requesting Klonopin.  She had finished her Klonopin earlier than her scheduled refill.  She admitted using more Klonopin because she feels under distress.  Patient has told not to take more Klonopin before.  She is worried about seizures which she had few months ago when she was out of Klonopin.  I recommended to try Lamictal which can also help her depression and anxiety symptoms.  Recommended to continue Vistaril and Seroquel at present dose.  Patient is scheduled to see me tomorrow.  No early Klonopin will be given.  I will call Lamictal 25 mg for a week and gradually increase to 50 mg daily.  Discussed medication side effects especially rash in that case she needed to stop the medication immediately.

## 2014-08-15 ENCOUNTER — Ambulatory Visit (HOSPITAL_COMMUNITY): Payer: Self-pay | Admitting: Psychiatry

## 2014-08-16 ENCOUNTER — Other Ambulatory Visit (HOSPITAL_COMMUNITY): Payer: Self-pay | Admitting: Psychiatry

## 2014-08-16 NOTE — Telephone Encounter (Signed)
Patient needs to be seen first. She missed appointment. No refills until seen.

## 2014-08-17 ENCOUNTER — Ambulatory Visit (INDEPENDENT_AMBULATORY_CARE_PROVIDER_SITE_OTHER): Payer: 59 | Admitting: Psychiatry

## 2014-08-17 ENCOUNTER — Encounter (HOSPITAL_COMMUNITY): Payer: Self-pay | Admitting: Psychiatry

## 2014-08-17 VITALS — BP 114/80 | HR 108 | Ht 66.5 in | Wt 144.0 lb

## 2014-08-17 DIAGNOSIS — F319 Bipolar disorder, unspecified: Secondary | ICD-10-CM

## 2014-08-17 DIAGNOSIS — F3132 Bipolar disorder, current episode depressed, moderate: Secondary | ICD-10-CM

## 2014-08-17 DIAGNOSIS — F419 Anxiety disorder, unspecified: Secondary | ICD-10-CM

## 2014-08-17 DIAGNOSIS — F1310 Sedative, hypnotic or anxiolytic abuse, uncomplicated: Secondary | ICD-10-CM

## 2014-08-17 MED ORDER — PAROXETINE HCL 40 MG PO TABS: 40.0000 mg | ORAL_TABLET | Freq: Every day | ORAL | Status: DC

## 2014-08-17 MED ORDER — QUETIAPINE FUMARATE 300 MG PO TABS: 600.0000 mg | ORAL_TABLET | Freq: Every day | ORAL | Status: DC

## 2014-08-17 MED ORDER — CLONAZEPAM 0.5 MG PO TABS: 0.5000 mg | ORAL_TABLET | Freq: Two times a day (BID) | ORAL | Status: DC

## 2014-08-17 NOTE — Progress Notes (Signed)
[facility] [id number] Progress Note   [name] [id number] 64 y.o.  08/17/2014 10:02 AM  Chief Complaint:  I finished my Klonopin earlier than I supposed to.  I have a lot of anxiety.    History of Present Illness:  [name] came for her follow-up appointment.  She has been calling us to get early refill of Klonopin.  She even went to emergency room to get early refills of Klonopin but it was denied.  She is 5 days earlier to refill her Klonopin.  Patient told she took extra Klonopin because she was nervous and anxious.  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  When I ask why she used Martha Clan and calling us if she was nervous she apologized.  2 days ago we started her on Lamictal because she was concerned she may have seizures if she was not given Klonopin.  Patient has history of seizures in August when she was not given Klonopin as she ran out early.  Patient endorsed multiple family issues and distress.  Her 2 children [place] out of [place].  She admitted feeling overwhelmed .  She is taking Seroquel and Paxil as prescribed.  She endorse poor sleep, racing thoughts but denies any active or passive suicidal parts or homicidal thought.  She had missed appointment with a therapist however she promised to keep appointment this time which is scheduled next week.  She is taking Lamictal 25 mg daily.  She's also taking Vistaril but she feels it does not help anxiety.  Patient denies drinking or using any illegal substances.  Patient denies any paranoia or any hallucination.  Her appetite is okay.  Her vitals are stable.  Patient lives by herself.    Suicidal Ideation: No Plan Formed: No Patient has means to carry out plan: No  Homicidal Ideation: No Plan Formed: No Patient has means to carry out plan: No  Past Psychiatric History/Hospitalization(s) Patient has history of bipolar disorder with severe mood swings, anger, impulsive behavior , mania and severe depression.  She  had a seizures when her Klonopin was discontinued by her primary care physician.  She has seen in this office by physician assistant for many years and then she decided to see her primary care physician for medication management.  She has no inpatient psychiatric treatment.     Anxiety: Yes Bipolar Disorder: Yes Depression: Yes Mania: Yes Psychosis: No Schizophrenia: No Personality Disorder: No Hospitalization for psychiatric illness: Patient was admitted because of seizures due to stopping Klonopin. History of Electroconvulsive Shock Therapy: No Prior Suicide Attempts: No  Medical History; The patient has GERD, osteoarthritis, hypothyroidism, polyps.  Her primary care physician is [facility].  She is seeing a physician assistant Benson Norway.  Review of Systems  Cardiovascular: Negative for chest pain and palpitations.  Skin: Negative for itching and rash.  Neurological: Negative for tremors.  Psychiatric/Behavioral: The patient is nervous/anxious.      Psychiatric: Agitation: No Hallucination: No Depressed Mood: No Insomnia: Yes Hypersomnia: No Altered Concentration: No Feels Worthless: No Grandiose Ideas: No Belief In Special Powers: No New/Increased Substance Abuse: No Compulsions: No  Neurologic: Headache: No Seizure: History of withdrawal seizures from benzodiazepine Paresthesias: No   Musculoskeletal: Strength & Muscle Tone: within normal limits Gait & Station: normal Patient leans: N/A   Outpatient Encounter Prescriptions as of 08/17/2014  Medication Sig  . albuterol (PROVENTIL HFA;VENTOLIN HFA) 108 (90 BASE) MCG/ACT inhaler Inhale 2 puffs into the lungs every 4 (four) hours as  needed for wheezing or shortness of breath.  . clonazePAM (KLONOPIN) 0.5 MG tablet Take 1 tablet (0.5 mg total) by mouth 2 (two) times daily.  Marland Kitchen lamoTRIgine (LAMICTAL) 25 MG tablet Take 1 tab daily for 1 week and than 2 tab daily  . levothyroxine (SYNTHROID, LEVOTHROID)  100 MCG tablet Take 100 mcg by mouth daily before breakfast.  . PARoxetine (PAXIL) 40 MG tablet Take 1 tablet (40 mg total) by mouth daily.  . QUEtiapine (SEROQUEL) 300 MG tablet Take 2 tablets (600 mg total) by mouth at bedtime.  . [DISCONTINUED] clonazePAM (KLONOPIN) 0.5 MG tablet Take 1 tablet (0.5 mg total) by mouth 2 (two) times daily.  . [DISCONTINUED] hydrOXYzine (VISTARIL) 50 MG capsule Take 1 capsule (50 mg total) by mouth as needed for anxiety. For nerves (Patient not taking: Reported on 07/26/2014)  . [DISCONTINUED] PARoxetine (PAXIL) 40 MG tablet Take 1 tablet (40 mg total) by mouth daily.  . [DISCONTINUED] QUEtiapine (SEROQUEL) 300 MG tablet Take 2 tablets (600 mg total) by mouth at bedtime.    No results found for this or any previous visit (from the past 2160 hour(s)).    Constitutional:  BP 114/80 mmHg  Pulse 108  Ht 5' 6.5" (1.689 m)  Wt 144 lb (65.318 kg)  BMI 22.90 kg/m2   Mental Status Examination;  Patient is casually dressed and fairly groomed.  She maintains fair eye contact.  She described her mood anxious and and her affect is mood appropriate.  She denies any auditory or visual hallucination.  She denies any active or passive suicidal thoughts or homicidal thoughts.  There were no delusions, paranoia or any obsessive thoughts.  Her speech is fast and fluent.  There are no flight of ideas or any loose association.  Her fund of knowledge is average.  Her attention and concentration is fair. Her thought process is circumstantial.  She has no active withdrawal symptoms from benzodiazepine.  She has no tremors or shakes.  Her psychomotor activity is slightly increased.  She is alert and oriented x3.  Her insight judgment and impulse control is fair.   Established Problem, Stable/Improving (1), New problem, with additional work up planned, Review of Psycho-Social Stressors (1), Decision to obtain old records (1), Review and summation of old records (2), Review of Last  Therapy Session (1), Review of Medication Regimen & Side Effects (2) and Review of New Medication or Change in Dosage (2)  Assessment: Axis I: Bipolar disorder1, anxiety disorder, benzo abuse  Axis II: Deferred  Axis III:  Past Medical History  Diagnosis Date  . Asthma   . Depression   . GERD (gastroesophageal reflux disease)   . Inflammatory polyps of colon     pt notes flat and is to have surgery to remove   . Osteoarthritis of both knees   . Hypothyroidism   . H/O hiatal hernia   . Seizure 03/06/2014    tonic clonic seizure which lasted about a minute  . Anxiety   . Bipolar disorder   . Benzodiazepine withdrawal with delirium 03/06/2014    Axis IV: Moderate   Plan:  I had a long discussion with the patient about her benzodiazepine abuse.  I reviewed records from emergency room.  Patient has a history of running out early of benzodiazepine.  She is prescribed Klonopin 0.5 mg twice a day.  Patient endorsed that it is not enough and she feels nervous and anxious.  She mentioned Vistaril is not working.  I would discontinue Vistaril.  We will authorize only 1 time early refill of Klonopin however we will not increase the dosage and I strongly recommended to keep Lamictal and we will consider increasing the dose in the future.  She is taking moderate doses of Seroquel.  Continue Seroquel 600 mg at bedtime and Paxil 40 mg daily.  Encouraged to keep appointment with a therapist.  Discuss if she did not follow-up with the therapist and treatment plan then we may refer her out.  Patient apologize for her behavior and promised that she will stick with the treatment plan.  I recommended to call us back if she has any question, concern or if she feels worsening of the symptoms.  She has no rash.  We will consider increasing Lamictal on her next appointment.  I will see her again in 4 weeks. Time spent 25 minutes.  More than 50% of the time spent in psychoeducation, counseling and coordination of  care.  Discuss safety plan that anytime having active suicidal thoughts or homicidal thoughts then patient need to call 911 or go to the local emergency room.   Gabrian Hoque T., MD 08/17/2014

## 2014-08-24 ENCOUNTER — Ambulatory Visit (INDEPENDENT_AMBULATORY_CARE_PROVIDER_SITE_OTHER): Payer: 59 | Admitting: Psychology

## 2014-08-24 DIAGNOSIS — F3132 Bipolar disorder, current episode depressed, moderate: Secondary | ICD-10-CM

## 2014-08-24 NOTE — Progress Notes (Signed)
   THERAPIST PROGRESS NOTE  Session Time: 10am-10.35am  Participation Level: Active  Behavioral Response: Well GroomedAlertDepressed  Type of Therapy: Individual Therapy  Treatment Goals addressed: Diagnosis: Bipolar 1 D/O and goal 1.  Interventions: CBT, Strength-based, Supportive and Other: tx plan development  Summary: [name] is a 64 y.o. female who presents with depressed affect congruent w/ report of depressed mood- pt reports grieving her brother.  Pt reported not further anxiety attacks since started back on Klonopin. Pt also reported that her deceased brother's wife is in the hospital w/ dx of cancer and stopping chemo- seeking palliative care.  Pt reports she is close to her but doesn't want to see her in that condition.  Pt reported that she didn't engage w/ family at Christmas- didn't have a ride to sister's, was recovering from pneumonia and didn't want to get anyone sick. Pt reported that nieces did bring a plate and this was nice.  Pt discussed that she just wants to handle better things that are hard for her and discussed awareness of negative self talk and related to put downs from others throughout her life.  Pt discussed relationship w/ her sister is the most recent of those- so not talking to her is good boundary.    Suicidal/Homicidal: Nowithout intent/plan  Therapist Response: Assessed pt current functioning per pt report.  Explored w/pt interactions w/ family members over the holidays.  Discussed pt grief and how she is preparing for sister- in laws death.  Discussed pt tx goals and developed plan.  Processed w/pt negative self talk/image and how impacted by interactions past and present.  Encouraged healthy relationships and boundaries.    Plan: Return again in 4 weeks.  Diagnosis:  Bipolar, Depressed        YATES,LEANNE, LPC 08/24/2014

## 2014-08-30 ENCOUNTER — Ambulatory Visit (HOSPITAL_COMMUNITY): Payer: Self-pay | Admitting: Psychiatry

## 2014-09-07 ENCOUNTER — Ambulatory Visit (HOSPITAL_COMMUNITY): Payer: Self-pay | Admitting: Psychiatry

## 2014-09-10 ENCOUNTER — Other Ambulatory Visit (HOSPITAL_COMMUNITY): Payer: Self-pay | Admitting: Psychiatry

## 2014-09-11 NOTE — Telephone Encounter (Signed)
Patient has her next appointment scheduled for 09/17/14 and should not need a refill of Lamictal until seen.

## 2014-09-17 ENCOUNTER — Encounter (HOSPITAL_COMMUNITY): Payer: Self-pay | Admitting: Psychiatry

## 2014-09-17 ENCOUNTER — Ambulatory Visit (INDEPENDENT_AMBULATORY_CARE_PROVIDER_SITE_OTHER): Payer: 59 | Admitting: Psychiatry

## 2014-09-17 VITALS — BP 124/85 | HR 103 | Ht 66.5 in | Wt 155.4 lb

## 2014-09-17 DIAGNOSIS — F3132 Bipolar disorder, current episode depressed, moderate: Secondary | ICD-10-CM

## 2014-09-17 DIAGNOSIS — F419 Anxiety disorder, unspecified: Secondary | ICD-10-CM

## 2014-09-17 DIAGNOSIS — F319 Bipolar disorder, unspecified: Secondary | ICD-10-CM

## 2014-09-17 DIAGNOSIS — F1310 Sedative, hypnotic or anxiolytic abuse, uncomplicated: Secondary | ICD-10-CM

## 2014-09-17 MED ORDER — LAMOTRIGINE 25 MG PO TABS: ORAL_TABLET | ORAL | Status: DC

## 2014-09-17 MED ORDER — PAROXETINE HCL 40 MG PO TABS: 40.0000 mg | ORAL_TABLET | Freq: Every day | ORAL | Status: DC

## 2014-09-17 MED ORDER — QUETIAPINE FUMARATE 300 MG PO TABS: 600.0000 mg | ORAL_TABLET | Freq: Every day | ORAL | Status: DC

## 2014-09-17 NOTE — Progress Notes (Signed)
[facility] [id number] Progress Note   [name] [id number] 64 y.o.  09/17/2014 10:28 AM  Chief Complaint:  I like Lamictal.  I have been out for past 3 days.  I'm not sleeping well.      History of Present Illness:  [name] came for her follow-up appointment.  She is taking her medication as prescribed however she's been out from Lamictal for past 3 days.  Lamictal was added on her last visit and she reported improvement in her anxiety and depression.  She has no rash or itching.  She admitted less irritability and anger with Lamictal.  She denies any rage, anger, severe mood swings .  She is not requesting early refills of Klonopin.  She is taking Seroquel 600 mg at bedtime.  Recently her primary care physician given meloxicam for her pain but she is not happy because she want narcotics which was denied.  Patient continued to stress about her family situation and does not get along with her sister.  Patient denies any agitation but admitted some time racing thoughts, poor sleep .  She denies any suicidal thoughts or homicidal thought.  Her appetite is okay but she has gained weight from the past.  Patient denies drinking or using any illegal substances.  She started seeing Georgette Shell and she like to continue therapy for the future.  Patient lives by herself.  Suicidal Ideation: No Plan Formed: No Patient has means to carry out plan: No  Homicidal Ideation: No Plan Formed: No Patient has means to carry out plan: No  Past Psychiatric History/Hospitalization(s) Patient has history of bipolar disorder with severe mood swings, anger, impulsive behavior , mania and severe depression.  She had a seizures when her Klonopin was discontinued by her primary care physician.  She has seen in this office by physician assistant for many years and then she decided to see her primary care physician for medication management.  She has no inpatient psychiatric treatment.     Anxiety: Yes Bipolar  Disorder: Yes Depression: Yes Mania: Yes Psychosis: No Schizophrenia: No Personality Disorder: No Hospitalization for psychiatric illness: Patient was admitted because of seizures due to stopping Klonopin. History of Electroconvulsive Shock Therapy: No Prior Suicide Attempts: No  Medical History; The patient has GERD, osteoarthritis, hypothyroidism, polyps.  Her primary care physician is [facility].  She is seeing a physician assistant Benson Norway.  Review of Systems  Constitutional: Negative for weight loss.  HENT: Negative for hearing loss.   Neurological: Negative for headaches.  Psychiatric/Behavioral: The patient is nervous/anxious and has insomnia.      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 albuterol (PROVENTIL HFA;VENTOLIN HFA) 108 (90 BASE) MCG/ACT inhaler Inhale 2 puffs into the lungs every 4 (four) hours as needed for wheezing or shortness of breath.  . clonazePAM (KLONOPIN) 0.5 MG tablet Take 1 tablet (0.5 mg total) by mouth 2 (two) times daily.  Marland Kitchen lamoTRIgine (LAMICTAL) 25 MG tablet Take 3  tab daily  . levothyroxine (SYNTHROID, LEVOTHROID) 100 MCG tablet Take 100 mcg by mouth daily before breakfast.  . meloxicam (  MOBIC) 15 MG tablet Take 15 mg by mouth daily.  Marland Kitchen. PARoxetine (PAXIL) 40 MG tablet Take 1 tablet (40 mg total) by mouth daily.  . QUEtiapine (SEROQUEL) 300 MG tablet Take 2 tablets (600 mg total) by mouth at bedtime.  Marland Kitchen. trimethoprim (TRIMPEX) 100 MG  tablet Take 100 mg by mouth daily.  . [DISCONTINUED] lamoTRIgine (LAMICTAL) 25 MG tablet Take 1 tab daily for 1 week and than 2 tab daily  . [DISCONTINUED] PARoxetine (PAXIL) 40 MG tablet Take 1 tablet (40 mg total) by mouth daily.  . [DISCONTINUED] QUEtiapine (SEROQUEL) 300 MG tablet Take 2 tablets (600 mg total) by mouth at bedtime.    No results found for this or any previous visit (from the past 2160 hour(s)).    Constitutional:  BP 124/85 mmHg  Pulse 103  Ht 5' 6.5" (1.689 m)  Wt 155 lb 6.4 oz (70.489 kg)  BMI 24.71 kg/m2   Mental Status Examination;  Patient is casually dressed and fairly groomed.  She maintains fair eye contact.  She described her mood anxious and and her affect is mood appropriate.  She denies any auditory or visual hallucination.  She denies any active or passive suicidal thoughts or homicidal thoughts.  There were no delusions, paranoia or any obsessive thoughts.  Her speech is fast and fluent.  There are no flight of ideas or any loose association.  Her fund of knowledge is average.  Her attention and concentration is fair. Her thought process is circumstantial.  She has no tremors or shakes.  Her psychomotor activity is slightly increased.  She is alert and oriented x3.  Her insight judgment and impulse control is fair.   Established Problem, Stable/Improving (1), Review of Psycho-Social Stressors (1), Review and summation of old records (2), Review of Last Therapy Session (1), Review of Medication Regimen & Side Effects (2) and Review of New Medication or Change in Dosage (2)  Assessment: Axis I: Bipolar disorder1, anxiety disorder, benzo abuse  Axis II: Deferred  Axis III:  Past Medical History  Diagnosis Date  . Asthma   . Depression   . GERD (gastroesophageal reflux disease)   . Inflammatory polyps of colon     pt notes flat and is to have surgery to remove   . Osteoarthritis of both knees   . Hypothyroidism   . H/O hiatal hernia   . Seizure  03/06/2014    tonic clonic seizure which lasted about a minute  . Anxiety   . Bipolar disorder   . Benzodiazepine withdrawal with delirium 03/06/2014    Plan:  Patient is taking her medication as prescribed.  She is taking Seroquel 600 mg at bedtime and Paxil 40 mg daily.  She is not asking early refills of Klonopin.  I would increase Lamictal 75 mg daily.  She does not have any rash or itching.  I encouraged to keep appointment with therapist for coping and social skills.  She continued to struggle having a relationship with her sister.  Discussed medication side effects and benefits.  Recommended to call us back if she has any question, concern or if she feels worsening of the symptoms. I will see her again in 4 weeks. Time spent 25 minutes.  More than 50% of the time spent in psychoeducation, counseling and coordination of care.  Discuss safety plan that anytime having active suicidal thoughts or homicidal thoughts then patient need to call 911 or go to the local emergency room.   Myrtie Leuthold T., MD 09/17/2014

## 2014-09-25 ENCOUNTER — Ambulatory Visit (HOSPITAL_COMMUNITY): Payer: Self-pay | Admitting: Psychology

## 2014-09-25 ENCOUNTER — Encounter (HOSPITAL_COMMUNITY): Payer: Self-pay | Admitting: Psychology

## 2014-09-25 NOTE — Progress Notes (Signed)
Brittany SloughCarolyn S Shrout is a 64 y.o. female patient who called today at 9am the time of her appointment to inform that she was having trouble getting transportation to appointment.  Pt reported that she just spoke with her neighbor and she can bring her but not sure how late she will be as neighbor is not ready yet and pt lives at least 10 minutes away.  Counselor spoke with her and informed if running 10-15 minutes late would need to reschedule.  Pt reported she would reschedule then.          Forde RadonYATES,Emilo Gras, LPC

## 2014-10-02 ENCOUNTER — Ambulatory Visit (INDEPENDENT_AMBULATORY_CARE_PROVIDER_SITE_OTHER): Payer: 59 | Admitting: Psychology

## 2014-10-02 DIAGNOSIS — F3132 Bipolar disorder, current episode depressed, moderate: Secondary | ICD-10-CM

## 2014-10-02 NOTE — Progress Notes (Signed)
   THERAPIST PROGRESS NOTE  Session Time: 9am-9.30am  Participation Level: Active  Behavioral Response: Fairly GroomedAlertDepressed  Type of Therapy: Individual Therapy  Treatment Goals addressed: Diagnosis: Bipolar 1 D/O and goal 1.  Interventions: CBT and Supportive  Summary: Brittany SloughCarolyn S Lineberry is a 64 y.o. female who presents with report of grieving sister in laws death.  Pt reported that she recently begun grieving the loss of her sister in law who was married to her brother who died 1.5 years ago.  Pt reported that she has been tearful.  Pt also reported feeling more anxious this morning and very tired as didn't sleep well last night.  Pt reported this is the first she didn't sleep well- waking a lot/restless sleep- since starting back w/ Seroquel.  Pt acknowledged that she "had a lot on her mind".  Pt reported that she wasn't able to attend the funeral and that she doesn't feel like can talk to family about her as not receptive and strained communication anyway. Pt did identify supports of her friends that could talk about grieving with if needed.  Pt reported that sister did bring this morning as called her when she didn't have any other transportation. She reported that communication was minimal on the way over-but no negative interactions.  Pt doesn't feel that sister is willing to work on the relationship.  Pt reported that she will focus on her own interactions as only that she can be responsible for in the relationship.   Suicidal/Homicidal: Nowithout intent/plan  Therapist Response: Assessed pt current functioning per pt report. Processed w/pt her grief she is experiencing and normalized and validated.   Explored w/pt ways she can seek closure- saying goodbye to sister in law- as wasn't able to attend funeral and encouraged pt to identify supports she could talk with.  Explored interactions w/ sister and discussed w/ pt can't change sister- but can focus on how she chooses to approach  and communicate w/ sister.   Plan: Return again in 4 weeks.  Diagnosis: Bipolar 1 D/O, depressed    Jennea Rager, LPC 10/02/2014

## 2014-10-13 ENCOUNTER — Other Ambulatory Visit (HOSPITAL_COMMUNITY): Payer: Self-pay | Admitting: Psychiatry

## 2014-10-14 NOTE — Telephone Encounter (Signed)
Lamictal refill denied as patient was given a new order 09/17/14 and is scheduled to return 10/16/14.

## 2014-10-16 ENCOUNTER — Encounter (HOSPITAL_COMMUNITY): Payer: Self-pay | Admitting: Psychiatry

## 2014-10-16 ENCOUNTER — Ambulatory Visit (INDEPENDENT_AMBULATORY_CARE_PROVIDER_SITE_OTHER): Payer: 59 | Admitting: Psychiatry

## 2014-10-16 VITALS — BP 158/84 | HR 108 | Ht 66.5 in | Wt 147.0 lb

## 2014-10-16 DIAGNOSIS — F319 Bipolar disorder, unspecified: Secondary | ICD-10-CM

## 2014-10-16 DIAGNOSIS — F1310 Sedative, hypnotic or anxiolytic abuse, uncomplicated: Secondary | ICD-10-CM

## 2014-10-16 DIAGNOSIS — F3132 Bipolar disorder, current episode depressed, moderate: Secondary | ICD-10-CM

## 2014-10-16 DIAGNOSIS — F419 Anxiety disorder, unspecified: Secondary | ICD-10-CM

## 2014-10-16 MED ORDER — LAMOTRIGINE 100 MG PO TABS: 100.0000 mg | ORAL_TABLET | Freq: Every day | ORAL | Status: DC

## 2014-10-16 MED ORDER — CLONAZEPAM 0.5 MG PO TABS: 0.5000 mg | ORAL_TABLET | Freq: Two times a day (BID) | ORAL | Status: DC

## 2014-10-16 MED ORDER — PAROXETINE HCL 40 MG PO TABS: 40.0000 mg | ORAL_TABLET | Freq: Every day | ORAL | Status: DC

## 2014-10-16 MED ORDER — QUETIAPINE FUMARATE 300 MG PO TABS: 600.0000 mg | ORAL_TABLET | Freq: Every day | ORAL | Status: DC

## 2014-10-16 NOTE — Progress Notes (Signed)
[facility] [id number] Progress Note   JANETH TERRY [id number] 64 y.o.  10/16/2014 11:24 AM  Chief Complaint:    still have anxiety and nervousness.  My nephew is in the hospital.       History of Present Illness:  Raevin came for her follow-up appointment.   She is compliant with her medication and denies any side effects.  Recently she has noticed some irritability, anxiety and poor sleep.  Her nephew is in the hospital and she is concerned about him.  Patient told her nephews father died 2 years ago in a car accident and recently his mother died and has been very difficult for his nephew.  She mentioned family was involved in car accident 2 years ago and due to recent complication from the surgery his nephew again admitted to the hospital.  She admitted some time racing thoughts, irritability and anxiety symptoms.  However she likes Lamictal which is helping her mood and depression.  She has no rash or itching.  We increased her Lamictal on her last visit and she tolerated very well.  She is seeing Glena Norfolk for counseling.  She denies any feeling of hopelessness or worthlessness.  She denies any hallucination or any paranoia.  She reported more social active and good energy level.  She lost weight from her last visit however her appetite and vitals are okay.  She denies drinking or using any illegal substances. Patient lives by herself.  Suicidal Ideation: No Plan Formed: No Patient has means to carry out plan: No  Homicidal Ideation: No Plan Formed: No Patient has means to carry out plan: No  Past Psychiatric History/Hospitalization(s) Patient has history of bipolar disorder with severe mood swings, anger, impulsive behavior , mania and severe depression.  She had a seizures when her Klonopin was discontinued by her primary care physician.  She has seen in this office by physician assistant for many years and then she decided to see her primary care physician for medication  management.  She has no inpatient psychiatric treatment.     Anxiety: Yes Bipolar Disorder: Yes Depression: Yes Mania: Yes Psychosis: No Schizophrenia: No Personality Disorder: No Hospitalization for psychiatric illness: Patient was admitted because of seizures due to stopping Klonopin. History of Electroconvulsive Shock Therapy: No Prior Suicide Attempts: No  Medical History; The patient has GERD, osteoarthritis, hypothyroidism, polyps.  Her primary care physician is [facility].  She is seeing a physician assistant Benson Norway.  Review of Systems  Skin: Negative for itching and rash.  Psychiatric/Behavioral: Positive for depression. Negative for suicidal ideas and substance abuse. The patient is nervous/anxious and has insomnia.    Psychiatric: Agitation: No Hallucination: No Depressed Mood: No Insomnia: Yes Hypersomnia: No Altered Concentration: No Feels Worthless: No Grandiose Ideas: No Belief In Special Powers: No New/Increased Substance Abuse: No Compulsions: No  Neurologic: Headache: No Seizure: History of withdrawal seizures from benzodiazepine Paresthesias: No   Musculoskeletal: Strength & Muscle Tone: within normal limits Gait & Station: normal Patient leans: N/A   Outpatient Encounter Prescriptions as of 10/16/2014  Medication Sig  . albuterol (PROVENTIL HFA;VENTOLIN HFA) 108 (90 BASE) MCG/ACT inhaler Inhale 2 puffs into the lungs every 4 (four) hours as needed for wheezing or shortness of breath.  . clonazePAM (KLONOPIN) 0.5 MG tablet Take 1 tablet (0.5 mg total) by mouth 2 (two) times daily.  Marland Kitchen lamoTRIgine (LAMICTAL) 100 MG tablet Take 1 tablet (100 mg total) by mouth daily.  Marland Kitchen levothyroxine (SYNTHROID, LEVOTHROID)  100 MCG tablet Take 100 mcg by mouth daily before breakfast.  . meloxicam (MOBIC) 15 MG tablet Take 15 mg by mouth daily.  Marland Kitchen PARoxetine (PAXIL) 40 MG tablet Take 1 tablet (40 mg total) by mouth daily.  . QUEtiapine (SEROQUEL) 300  MG tablet Take 2 tablets (600 mg total) by mouth at bedtime.  Marland Kitchen trimethoprim (TRIMPEX) 100 MG tablet Take 100 mg by mouth daily.  . [DISCONTINUED] clonazePAM (KLONOPIN) 0.5 MG tablet Take 1 tablet (0.5 mg total) by mouth 2 (two) times daily.  . [DISCONTINUED] lamoTRIgine (LAMICTAL) 25 MG tablet Take 3  tab daily  . [DISCONTINUED] PARoxetine (PAXIL) 40 MG tablet Take 1 tablet (40 mg total) by mouth daily.  . [DISCONTINUED] QUEtiapine (SEROQUEL) 300 MG tablet Take 2 tablets (600 mg total) by mouth at bedtime.    No results found for this or any previous visit (from the past 2160 hour(s)).    Constitutional:  BP 158/84 mmHg  Pulse 108  Ht 5' 6.5" (1.689 m)  Wt 147 lb (66.679 kg)  BMI 23.37 kg/m2   Mental Status Examination;  Patient is casually dressed and fairly groomed.  She maintains fair eye contact.  She described her mood anxious and and her affect is mood appropriate.  She denies any auditory or visual hallucination.  She denies any active or passive suicidal thoughts or homicidal thoughts.  There were no delusions, paranoia or any obsessive thoughts.  Her speech is fast and fluent.  There are no flight of ideas or any loose association.  Her fund of knowledge is average.  Her attention and concentration is fair. Her thought process is circumstantial.  She has no tremors or shakes.  Her psychomotor activity is slightly increased.  She is alert and oriented x3.  Her insight judgment and impulse control is fair.   Established Problem, Stable/Improving (1), Review of Psycho-Social Stressors (1), Review and summation of old records (2), Review of Last Therapy Session (1), Review of Medication Regimen & Side Effects (2) and Review of New Medication or Change in Dosage (2)  Assessment: Axis I: Bipolar disorder1, anxiety disorder, benzo abuse  Axis II: Deferred  Axis III:  Past Medical History  Diagnosis Date  . Asthma   . Depression   . GERD (gastroesophageal reflux disease)   .  Inflammatory polyps of colon     pt notes flat and is to have surgery to remove   . Osteoarthritis of both knees   . Hypothyroidism   . H/O hiatal hernia   . Seizure 03/06/2014    tonic clonic seizure which lasted about a minute  . Anxiety   . Bipolar disorder   . Benzodiazepine withdrawal with delirium 03/06/2014    Plan:  Patient is doing better on her medication.  She is tolerating her Seroquel, Paxil, Lamictal and Klonopin twice a day.  She has no tremors or shakes.  I recommended to increase Lamictal 100 mg daily and continue Seroquel 600 mg at bedtime, Paxil 40 mg daily and Klonopin 0.5 mg twice a day.  She is not asking for early refills of Klonopin.   I encouraged to keep appointment with Adella Hare for counseling and therapy.  Recommended to call us back if she has any question or any concern.  Follow-up in 2 months. Time spent 25 minutes.  More than 50% of the time spent in psychoeducation, counseling and coordination of care.  Discuss safety plan that anytime having active suicidal thoughts or homicidal thoughts then patient need  to call 911 or go to the local emergency room.   Dolora Ridgely T., MD 10/16/2014

## 2014-10-31 ENCOUNTER — Telehealth (HOSPITAL_COMMUNITY): Payer: Self-pay

## 2014-10-31 ENCOUNTER — Ambulatory Visit (INDEPENDENT_AMBULATORY_CARE_PROVIDER_SITE_OTHER): Payer: 59 | Admitting: Psychology

## 2014-10-31 DIAGNOSIS — F3132 Bipolar disorder, current episode depressed, moderate: Secondary | ICD-10-CM

## 2014-10-31 MED ORDER — HYDROXYZINE PAMOATE 50 MG PO CAPS: 50.0000 mg | ORAL_CAPSULE | Freq: Two times a day (BID) | ORAL | Status: DC

## 2014-10-31 NOTE — Telephone Encounter (Signed)
Met with patient who was in seeing Jan Fireman, therapist today to question her status with a new rash.  Assessed patient's status with the rash and recent start and then increase on Lamictal.  Rash covers patient's front breast area up to her shoulders, is red, profuse and blistering.  Informed Dr. Adele Schilder who evaluated patient and agreed rash due to Lamictal.  Patient instructed to stop all Lamictal immediately.  Dr. Adele Schilder authorized a one week supply of Vistaril 39m, one twice a day, #14 to help with patient's current reaction, itching and burning.  Warned patient of the side effects to Hydroxyzine and discussed her need to be careful with standing and moving too quickly due to the sedating effect of the medication.  Dr. AAdele Schilderrequested patient follow up with her primary care provider if not improved in a few days or to go to a local ED if rash and condition worsened.  Patient did report taking medication this morning and discussed how she should take Vistaril.  Patient agreed to return to see dr. AAdele Schilderin 2 weeks and was given a new appointment for 11/16/14 by [place]  Patient agreed to call this nurse back on Monday 11/05/14 to check in on her status and stated understanding all instruction.  Patient stated she would not take any more Lamictal and agreed to pick up Vistaril order that [facility]scribed [facility] [address]  [facility] to verify they received the order and would have ready for patient this date.  Patient to call back as needed.

## 2014-10-31 NOTE — Progress Notes (Signed)
   THERAPIST PROGRESS NOTE  Session Time: 9.50am-10.23am  Participation Level: Active  Behavioral Response: Well GroomedAlertAFFECT WNL  Type of Therapy: Individual Therapy  Treatment Goals addressed: Diagnosis: Bipolar 1 D/O and goal 1.  Interventions: Supportive and Other: Stress reduction  Summary: Brittany SloughCarolyn S [name] is a 64 y.o. female who presents with affect that is generally full and bright.  Pt reported that she has been feeling better- mood less depressed and overall anxiety well managed.  Pt did report felt anxious yesterday but had already taken her Klonopin- so instead of taking more went to lay down and watch tv.  Pt reported that this was helpful.  Pt was able to identify that might have felt more anxious as cutting back on her smoking.  Pt reported that she is still grieving loss of sister in law and that her nephew is still in the hospital.  Pt reported that her sister doesn't keep her updated on her nephew like she would like.  Pt feels that she is keeping good boundaries.  Pt reported today she is feeling irritated as has a rash on her chest.  Pt shows the counselor the rash and guesses it has been present for about a week.  Pt not sure if shingles or rash for another reason.  Pt is taking Lamictal and hadn't noted rash before now.  Pt is brought to Brittany BallsShawn Taylor, NP to f/u re: rash.  Suicidal/Homicidal: Nowithout intent/plan  Therapist Response: Assessed pt current functioning per pt report.  Processed w/pt her mood and recent stressors.  Validated and normalized grief process pt is experiencing.  Reflected improvement w/ mood and affect to pt from initial contact.  Explored w/pt relaxation strategies she is using when feeling anxious and discussed importance of relaxation in addition to medication to assist coping w/ anxiety.    Plan: Return again in 2-3 weeks.  Pt brought to Brittany BallsShawn Taylor, NP to f/u re: rash she presents with today.  Diagnosis: Bipolar 1 D/O.        Brittany RadonYATES,[name], [facility] 10/31/2014

## 2014-11-01 ENCOUNTER — Telehealth (HOSPITAL_COMMUNITY): Payer: Self-pay | Admitting: *Deleted

## 2014-11-01 NOTE — Telephone Encounter (Signed)
Attempted to reach patient back after message left this morning to follow up on her status with rash from Lamictal and reiterated to patient on her message to make sure she is taking no more Lamictal and if needed, rash getting worse to follow up with her PCP or an emergency department for evaluation if needed.  Reminded patient to make sure she is using hydroxyzine to help with side effects from medication reaction and requested patient call this nurse back to discuss.

## 2014-11-01 NOTE — Telephone Encounter (Signed)
[name],   Patient called and left message on nurse's voice mail.  Patient stated that she cannot remember what she did this morning but thinks she took Lamictal.  Patient stated that the rash is getting worse and very painful.  Patient request call back.

## 2014-11-01 NOTE — Telephone Encounter (Signed)
Telephone call with patient to discuss her status with not being sure if she took any more Lamictal this morning.  Patient stayed on the phone with this nurse to make sure she identified the correct medication to discard from her bottles of daily medications.  Patient stated she was still itching but taking the hydroxyzine to help.  Patient reported she did not take the lamictal this morning and will call back on Monday 11/05/14 to check in on her status.  Patient will seek out more medical care if symptoms continue or worsen.

## 2014-11-05 ENCOUNTER — Telehealth (HOSPITAL_COMMUNITY): Payer: Self-pay

## 2014-11-05 DIAGNOSIS — F3132 Bipolar disorder, current episode depressed, moderate: Secondary | ICD-10-CM

## 2014-11-05 NOTE — Telephone Encounter (Signed)
Telephone call with patient to follow up on her status with recent reaction to Lamictal and patient's report of rash improving, less red and not itching or burning as much.  Patient reported the rash was beginning to decrease and stated the hydroxyzine had helped with the itching.  Patient stated she may want a few more days of hydroxyzine medication as the rash continues to improve.  Informed would question [name] about Jaynie Breamokaying additional days if desired.  Patient stated she would call back on Wednesday 11/07/14 with an update on status and to inform if would like a refill at that time.  Patient stated feeling better and not taking any Lamictal now as instructed.

## 2014-11-07 ENCOUNTER — Other Ambulatory Visit (HOSPITAL_COMMUNITY): Payer: Self-pay | Admitting: Psychiatry

## 2014-11-07 NOTE — Telephone Encounter (Signed)
Patient called today.  Patient stated that Hydroxyzine is helping, rash is almost gone.  Patient request refill on Hydroxyzine until appointment on 4-8. Would you like to do refill?   Thank you

## 2014-11-08 MED ORDER — HYDROXYZINE PAMOATE 50 MG PO CAPS: 50.0000 mg | ORAL_CAPSULE | Freq: Two times a day (BID) | ORAL | Status: DC

## 2014-11-08 NOTE — Telephone Encounter (Signed)
Verbal consent from [name], okay to send 15 capsules of hydroxyzine.  Patient notified medication sent.

## 2014-11-13 ENCOUNTER — Telehealth (HOSPITAL_COMMUNITY): Payer: Self-pay

## 2014-11-13 NOTE — Telephone Encounter (Signed)
I called patient back.  I advised patient that per [name] patient needs to contact PCP or go to the ER or Urgent Care.  Patient stated rash went away and then came back.  Patient stated that she did change her laundry detergent.  I advised patient she may want to go back to the laundry detergent she was using and see if that helps.  I advised patient that she needs to see her PCP because if rash keeps going away and coming back need to know why.  Patient stated she does not have a car, depends on rides.  Will have to see if she can get a ride to her PCP or urgent care.

## 2014-11-13 NOTE — Telephone Encounter (Signed)
Medication management - Patient reports her rash has returned on her upper chest and neck areas.  States appears as red and small blisters.  - States she is not taking Lamictal and has not since last conversation but is concerned about return of rash and why? - States Hydroxyzine not working as well now either and questions if can take Benedryl.  Has an appointment for 11/16/14 but would like a call back with instructions of what she should do.  Reported she could not come in prior to Friday due to no transportation.

## 2014-11-13 NOTE — Telephone Encounter (Signed)
She should see her physician for the rash.  It may not be Lamictal since she is not taking it.

## 2014-11-16 ENCOUNTER — Encounter (HOSPITAL_COMMUNITY): Payer: Self-pay | Admitting: Psychiatry

## 2014-11-16 ENCOUNTER — Ambulatory Visit (INDEPENDENT_AMBULATORY_CARE_PROVIDER_SITE_OTHER): Payer: 59 | Admitting: Psychiatry

## 2014-11-16 VITALS — BP 119/82 | HR 112 | Ht 66.5 in | Wt 156.0 lb

## 2014-11-16 DIAGNOSIS — F419 Anxiety disorder, unspecified: Secondary | ICD-10-CM | POA: Diagnosis not present

## 2014-11-16 DIAGNOSIS — F1310 Sedative, hypnotic or anxiolytic abuse, uncomplicated: Secondary | ICD-10-CM

## 2014-11-16 DIAGNOSIS — F3132 Bipolar disorder, current episode depressed, moderate: Secondary | ICD-10-CM | POA: Diagnosis not present

## 2014-11-16 MED ORDER — PAROXETINE HCL 40 MG PO TABS: 40.0000 mg | ORAL_TABLET | Freq: Every day | ORAL | Status: DC

## 2014-11-16 MED ORDER — CLONAZEPAM 0.5 MG PO TABS: 0.5000 mg | ORAL_TABLET | Freq: Two times a day (BID) | ORAL | Status: DC

## 2014-11-16 MED ORDER — QUETIAPINE FUMARATE 300 MG PO TABS: 600.0000 mg | ORAL_TABLET | Freq: Every day | ORAL | Status: DC

## 2014-11-16 NOTE — Progress Notes (Signed)
[facility] [id number] Progress Note   [name] [id number] 64 y.o.  11/16/2014 11:16 AM  Chief Complaint:  My rash is getting better.  I am not taking Lamictal.       History of Present Illness:  [name] came for her follow-up appointment.   Earlier she called complaining of rash with the Lamictal and she was recommended to stop the medication.  She was given Vistaril to help the rash.  Her rash are resolving and she is feeling much better.  She is no longer taking Vistaril.  She is using tropical cream because sometimes she has 8.  Overall her anxiety, irritability and mood is better.  She sleeping good.  She wants to continue Seroquel, Paxil and Klonopin.  She denies any tremors or shakes.  She is seeing Adella Hare for counseling.  Her energy level is good.  She is not abusing her benzo.  She denies any feeling of hopelessness or worthlessness.  At this time she does not want to try a new medication.  Her appetite is okay.  Her vitals are stable.    Suicidal Ideation: No Plan Formed: No Patient has means to carry out plan: No  Homicidal Ideation: No Plan Formed: No Patient has means to carry out plan: No  Past Psychiatric History/Hospitalization(s) Patient has history of bipolar disorder with severe mood swings, anger, impulsive behavior , mania and severe depression.  She had a seizures when her Klonopin was discontinued by her primary care physician.  She has seen in this office by physician assistant for many years and then she decided to see her primary care physician for medication management.  She has no inpatient psychiatric treatment.  We tried Lamictal but patient developed rash. Anxiety: Yes Bipolar Disorder: Yes Depression: Yes Mania: Yes Psychosis: No Schizophrenia: No Personality Disorder: No Hospitalization for psychiatric illness: Patient was admitted because of seizures due to stopping Klonopin. History of Electroconvulsive Shock Therapy: No Prior  Suicide Attempts: No  Medical History; The patient has GERD, osteoarthritis, hypothyroidism, polyps.  Her primary care physician is [facility].  She is seeing a physician assistant Benson Norway.  Review of Systems  Skin:       Rash on her neck is resolving.   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 albuterol (PROVENTIL HFA;VENTOLIN HFA) 108 (90 BASE) MCG/ACT inhaler Inhale 2 puffs into the lungs every 4 (four) hours as needed for wheezing or shortness of breath.  . clonazePAM (KLONOPIN) 0.5 MG tablet Take 1 tablet (0.5 mg total) by mouth 2 (two) times daily.  . hydrOXYzine (VISTARIL) 50 MG capsule Take 1 capsule (50 mg total) by mouth 2 (two) times daily.  Marland Kitchen levothyroxine (SYNTHROID, LEVOTHROID) 100 MCG tablet Take 100 mcg by mouth daily before breakfast.  . meloxicam (MOBIC) 15 MG tablet Take 15 mg by mouth daily.  Marland Kitchen PARoxetine (PAXIL) 40 MG tablet Take 1 tablet (40 mg total) by mouth daily.  . QUEtiapine (SEROQUEL) 300 MG tablet Take 2 tablets (600 mg total) by mouth at bedtime.  Marland Kitchen trimethoprim (TRIMPEX) 100 MG tablet Take 100 mg  by mouth daily.  . [DISCONTINUED] clonazePAM (KLONOPIN) 0.5 MG tablet Take 1 tablet (0.5 mg total) by mouth 2 (two) times daily.  . [DISCONTINUED] clonazePAM (KLONOPIN) 0.5 MG tablet Take 1 tablet (0.5 mg total) by mouth 2 (two) times daily.  . [DISCONTINUED] lamoTRIgine (LAMICTAL) 100 MG tablet Take 1 tablet (100 mg total) by mouth daily.  . [DISCONTINUED]  PARoxetine (PAXIL) 40 MG tablet Take 1 tablet (40 mg total) by mouth daily.  . [DISCONTINUED] QUEtiapine (SEROQUEL) 300 MG tablet Take 2 tablets (600 mg total) by mouth at bedtime.    No results found for this or any previous visit (from the past 2160 hour(s)).    Constitutional:  BP 119/82 mmHg  Pulse 112  Ht 5' 6.5" (1.689 m)  Wt 156 lb (70.761 kg)  BMI 24.80 kg/m2   Mental Status Examination;  Patient is casually dressed and fairly groomed.  She maintains fair eye contact.  She is pleasant and cooperative.  She described her mood euthymic and her affect is appropriate.  She denies any auditory or visual hallucination.  She denies any active or passive suicidal thoughts or homicidal thoughts.  There were no delusions, paranoia or any obsessive thoughts.  Her speech is clear and fluent.  There are no flight of ideas or any loose association.  Her fund of knowledge is average.  Her attention and concentration is fair. Her thought process is logical and goal-directed. She has no tremors or shakes.  Her psychomotor activity is slightly increased.  She is alert and oriented x3.  Her insight judgment and impulse control is fair.   Established Problem, Stable/Improving (1), Review of Psycho-Social Stressors (1), Review and summation of old records (2), Review of Last Therapy Session (1) and Review of Medication Regimen & Side Effects (2)  Assessment: Axis I: Bipolar disorder1, anxiety disorder, benzo abuse  Axis II: Deferred  Axis III:  Past Medical History  Diagnosis Date  . Asthma   . Depression   . GERD (gastroesophageal reflux disease)   . Inflammatory polyps of colon     pt notes flat and is to have surgery to remove   . Osteoarthritis of both knees   . Hypothyroidism   . H/O hiatal hernia   . Seizure 03/06/2014    tonic clonic seizure which lasted about a minute  . Anxiety   . Bipolar disorder   . Benzodiazepine withdrawal with delirium 03/06/2014    Plan:  Patient is no  longer taking rash and does not require Vistaril.  Her rash is getting better.  She wants to continue Seroquel 600 mg at bedtime, Paxil 40 mg daily and Klonopin 0.5 mg twice a day.  Discussed medication side effects and benefits.  Encouraged to see therapist for coping skills.  Recommended to call us back if she has any question or any concern.  Follow-up in 2 months.  Ly Bacchi T., MD 11/16/2014

## 2014-11-26 ENCOUNTER — Encounter (HOSPITAL_COMMUNITY): Payer: Self-pay | Admitting: Emergency Medicine

## 2014-11-26 ENCOUNTER — Emergency Department (INDEPENDENT_AMBULATORY_CARE_PROVIDER_SITE_OTHER)
Admission: EM | Admit: 2014-11-26 | Discharge: 2014-11-26 | Disposition: A | Discharge status: Home / Self Care | Payer: Medicare Other | Source: Home / Self Care | Attending: Emergency Medicine | Admitting: Emergency Medicine

## 2014-11-26 DIAGNOSIS — R21 Rash and other nonspecific skin eruption: Secondary | ICD-10-CM | POA: Diagnosis not present

## 2014-11-26 DIAGNOSIS — T887XXA Unspecified adverse effect of drug or medicament, initial encounter: Secondary | ICD-10-CM | POA: Diagnosis not present

## 2014-11-26 DIAGNOSIS — T50905A Adverse effect of unspecified drugs, medicaments and biological substances, initial encounter: Secondary | ICD-10-CM

## 2014-11-26 MED ORDER — HYDROCORTISONE 2.5 % EX LOTN: TOPICAL_LOTION | Freq: Two times a day (BID) | CUTANEOUS | Status: AC | PRN

## 2014-11-26 MED ORDER — CETIRIZINE HCL 10 MG PO TABS: 10.0000 mg | ORAL_TABLET | Freq: Every day | ORAL | Status: DC

## 2014-11-26 MED ORDER — RANITIDINE HCL 150 MG PO CAPS: 150.0000 mg | ORAL_CAPSULE | Freq: Two times a day (BID) | ORAL | Status: DC

## 2014-11-26 MED ORDER — METHYLPREDNISOLONE ACETATE 80 MG/ML IJ SUSP
80.0000 mg | Freq: Once | INTRAMUSCULAR | Status: AC
  Administered 2014-11-26: 80 mg via INTRAMUSCULAR

## 2014-11-26 MED ORDER — METHYLPREDNISOLONE ACETATE 80 MG/ML IJ SUSP
INTRAMUSCULAR | Status: AC
  Filled 2014-11-26: qty 1

## 2014-11-26 MED ORDER — HYDROCODONE-ACETAMINOPHEN 5-325 MG PO TABS: 1.0000 | ORAL_TABLET | Freq: Four times a day (QID) | ORAL | Status: DC | PRN

## 2014-11-26 NOTE — ED Provider Notes (Addendum)
CSN: [id number]     Arrival date & time 11/26/14  [id number] History   First MD Initiated Contact with Patient 11/26/14 [phone]     Chief Complaint  Patient presents with  . Rash   (Consider location/radiation/quality/duration/timing/severity/associated sxs/prior Treatment) HPI She is a 64 year old woman here for evaluation of rash. She states this started about 2 weeks ago after starting Lamictal. She called her mental health provider who stopped the Lamictal and put her on hydroxyzine. The rash did improve, but it has recurred in the last week. It seems to get worse after she goes out in the sun. It is located on her chest. She states it will blister in the sun. It is very itchy. She states the hydroxyzine did not help much.  Past Medical History  Diagnosis Date  . Asthma   . Depression   . GERD (gastroesophageal reflux disease)   . Inflammatory polyps of colon     pt notes flat and is to have surgery to remove   . Osteoarthritis of both knees   . Hypothyroidism   . H/O hiatal hernia   . Seizure 03/06/2014    tonic clonic seizure which lasted about a minute  . Anxiety   . Bipolar disorder   . Benzodiazepine withdrawal with delirium 03/06/2014   Past Surgical History  Procedure Laterality Date  . Cataract extraction w/ intraocular lens  implant, bilateral Bilateral   . Rotator cuff repair Right   . Vaginal hysterectomy  01/2003    lap assisted w/BSO/notes 12/23/2010  . Tubal ligation     Family History  Problem Relation Age of Onset  . Cancer Mother   . Cancer Brother   . Cancer Brother    History  Substance Use Topics  . Smoking status: Current Every Day Smoker -- 1.00 packs/day for 44 years    Types: Cigarettes  . Smokeless tobacco: Never Used  . Alcohol Use: No   OB History    No data available     Review of Systems As in history of present illness Allergies  Lamictal  Home Medications   Prior to Admission medications   Medication Sig Start Date End Date Taking?  Authorizing Provider  albuterol (PROVENTIL HFA;VENTOLIN HFA) 108 (90 BASE) MCG/ACT inhaler Inhale 2 puffs into the lungs every 4 (four) hours as needed for wheezing or shortness of breath.    Historical Provider, MD  cetirizine (ZYRTEC) 10 MG tablet Take 1 tablet (10 mg total) by mouth daily. 11/26/14   Charm RingsErin J Nyiesha Beever, MD  clonazePAM (KLONOPIN) 0.5 MG tablet Take 1 tablet (0.5 mg total) by mouth 2 (two) times daily. 11/16/14   Cleotis NipperSyed T Arfeen, MD  hydrocortisone 2.5 % lotion Apply topically 2 (two) times daily as needed. For itching 11/26/14   Charm RingsErin J Harlon Kutner, MD  levothyroxine (SYNTHROID, LEVOTHROID) 100 MCG tablet Take 100 mcg by mouth daily before breakfast.    Historical Provider, MD  meloxicam (MOBIC) 15 MG tablet Take 15 mg by mouth daily. 08/28/14   Historical Provider, MD  PARoxetine (PAXIL) 40 MG tablet Take 1 tablet (40 mg total) by mouth daily. 11/16/14   Cleotis NipperSyed T Arfeen, MD  QUEtiapine (SEROQUEL) 300 MG tablet Take 2 tablets (600 mg total) by mouth at bedtime. 11/16/14   Cleotis NipperSyed T Arfeen, MD  ranitidine (ZANTAC) 150 MG capsule Take 1 capsule (150 mg total) by mouth 2 (two) times daily. 11/26/14   Charm RingsErin J Leanor Voris, MD  trimethoprim (TRIMPEX) 100 MG tablet Take 100 mg by mouth  daily. 08/27/14   Historical Provider, MD   BP 122/89 mmHg  Pulse 102  Temp(Src) 98.7 F (37.1 C) (Oral)  Resp 16  SpO2 98% Physical Exam  Constitutional: She is oriented to person, place, and time. She appears well-developed and well-nourished. No distress.  Cardiovascular: Normal rate.   Pulmonary/Chest: Effort normal.  Neurological: She is alert and oriented to person, place, and time.  Skin:  Erythematous hive-like rash on her chest. It is in the distribution of the exposed skin from a v-neck shirt.    ED Course  Procedures (including critical care time) Labs Review Labs Reviewed - No data to display  Imaging Review No results found.   MDM   1. Drug reaction, initial encounter    Depo-Medrol 80 mg IM given.  We'll  treat with Zyrtec, ranitidine, hydrocortisone 2.5% cream. Recommended use of sunscreen. Okay to use over-the-counter Benadryl or Benadryl cream as well. Discussed a prednisone taper, but she cannot tolerate prednisone as it makes her "hateful." Follow-up with PCP in 1 week if no improvement.   Charm Rings, MD 11/26/14 1049  Charm Rings, MD 11/26/14 1058

## 2014-11-26 NOTE — Discharge Instructions (Signed)
You are having some lingering reaction to the medicine.  This is likely exacerbated by exposure to sunlight. Wear sunscreen. Take Zyrtec daily and ranitidine twice a day to help with the rash and itching. You can take Benadryl every 6 hours as needed for itching. You can use the hydrocortisone cream twice a day as needed for itching. You can get over-the-counter Benadryl cream that you can use twice a day in addition to the hydrocortisone cream. Follow-up here or with your PCP if no improvement in 1 week.

## 2014-11-26 NOTE — ED Notes (Signed)
Rash, onset 2 weeks ago

## 2014-12-04 ENCOUNTER — Ambulatory Visit (HOSPITAL_COMMUNITY): Payer: Self-pay | Admitting: Psychology

## 2014-12-17 ENCOUNTER — Ambulatory Visit (HOSPITAL_COMMUNITY): Payer: Self-pay | Admitting: Psychiatry

## 2014-12-25 ENCOUNTER — Ambulatory Visit (INDEPENDENT_AMBULATORY_CARE_PROVIDER_SITE_OTHER): Payer: 59 | Admitting: Psychology

## 2014-12-25 DIAGNOSIS — F3132 Bipolar disorder, current episode depressed, moderate: Secondary | ICD-10-CM | POA: Diagnosis not present

## 2014-12-25 NOTE — Progress Notes (Signed)
   THERAPIST PROGRESS NOTE  Session Time: 10.55am-11.30am  Participation Level: Active  Behavioral Response: Well GroomedAlertDepressed  Type of Therapy: Individual Therapy  Treatment Goals addressed: Diagnosis: bipolar 1 D/O and goal 1.  Interventions: CBT and Supportive  Summary: [name] is a 64 y.o. female who presents with depressed affect.  Pt reported that she has been feeling a little "weepy" lately.  Pt did report also that she is not sleeping well- will fall asleep then wake couple hours later and difficulty going back to sleep.  Pt reported that is wondering if her medications need to be adjusted.  Pt did report another loss- death of friend from cancer.  Pt expresses that she is feeling anxious as well that will have another incident of seizures as did previously when withdraws.  Pt aware that she is not in the same situation- taking meds as prescribed and receiving her refills. Pt also reported that she feels that she has been increasingly forgetful and this bothers her. Pt reported that sister brought today and so far interactions have been ok- pt states still has times that sister will yell at her and patronize her. Pt did ask to borrow money in session as needs to refill medication for bladder- pt increased awareness that not appropriate boundary.  Pt states she was avoiding asking sister as may lead to conflict.  Pt reported that her daughter has been contacting her more frequently and other daughter visited for mother's day and that this has been nice.     Suicidal/Homicidal: Nowithout intent/plan  Therapist Response: Assessed pt current functioning per pt report.  Explored w/pt any stressors or other changes that may be effecting mood.  Encouraged good sleep hygiene.  Informed I would let [name] know of her concerns.  Processed both stressful and positive family interactions.  Discussed w/ pt keeping healthy boundaries with her sister.   Plan: Return again one  month.  F/u as scheduled w/ [name].   Diagnosis: Bipolar 1 D/O- depressed moderate    [name], LPC 12/25/2014

## 2015-01-16 ENCOUNTER — Encounter (HOSPITAL_COMMUNITY): Payer: Self-pay | Admitting: Psychiatry

## 2015-01-16 ENCOUNTER — Ambulatory Visit (INDEPENDENT_AMBULATORY_CARE_PROVIDER_SITE_OTHER): Payer: 59 | Admitting: Psychiatry

## 2015-01-16 VITALS — BP 130/88 | HR 112 | Ht 66.5 in | Wt 161.0 lb

## 2015-01-16 DIAGNOSIS — F1310 Sedative, hypnotic or anxiolytic abuse, uncomplicated: Secondary | ICD-10-CM | POA: Diagnosis not present

## 2015-01-16 DIAGNOSIS — F3132 Bipolar disorder, current episode depressed, moderate: Secondary | ICD-10-CM | POA: Diagnosis not present

## 2015-01-16 DIAGNOSIS — F419 Anxiety disorder, unspecified: Secondary | ICD-10-CM | POA: Diagnosis not present

## 2015-01-16 DIAGNOSIS — Z79899 Other long term (current) drug therapy: Secondary | ICD-10-CM

## 2015-01-16 LAB — CBC WITH DIFFERENTIAL/PLATELET
Basophils Absolute: 0.1 10*3/uL (ref 0.0–0.1)
Basophils Relative: 1 % (ref 0–1)
EOS ABS: 0.4 10*3/uL (ref 0.0–0.7)
Eosinophils Relative: 7 % — ABNORMAL HIGH (ref 0–5)
HCT: 41.6 % (ref 36.0–46.0)
Hemoglobin: 13.4 g/dL (ref 12.0–15.0)
LYMPHS PCT: 34 % (ref 12–46)
Lymphs Abs: 1.7 10*3/uL (ref 0.7–4.0)
MCH: 31.1 pg (ref 26.0–34.0)
MCHC: 32.2 g/dL (ref 30.0–36.0)
MCV: 96.5 fL (ref 78.0–100.0)
MONOS PCT: 7 % (ref 3–12)
MPV: 9.6 fL (ref 8.6–12.4)
Monocytes Absolute: 0.4 10*3/uL (ref 0.1–1.0)
NEUTROS ABS: 2.6 10*3/uL (ref 1.7–7.7)
Neutrophils Relative %: 51 % (ref 43–77)
Platelets: 299 10*3/uL (ref 150–400)
RBC: 4.31 MIL/uL (ref 3.87–5.11)
RDW: 15 % (ref 11.5–15.5)
WBC: 5.1 10*3/uL (ref 4.0–10.5)

## 2015-01-16 LAB — COMPLETE METABOLIC PANEL WITH GFR
ALBUMIN: 4 g/dL (ref 3.5–5.2)
ALK PHOS: 118 U/L — AB (ref 39–117)
ALT: 10 U/L (ref 0–35)
AST: 13 U/L (ref 0–37)
BUN: 8 mg/dL (ref 6–23)
CO2: 24 mEq/L (ref 19–32)
Calcium: 9.2 mg/dL (ref 8.4–10.5)
Chloride: 108 mEq/L (ref 96–112)
Creat: 0.93 mg/dL (ref 0.50–1.10)
GFR, EST AFRICAN AMERICAN: 76 mL/min
GFR, Est Non African American: 66 mL/min
Glucose, Bld: 74 mg/dL (ref 70–99)
Potassium: 4.8 mEq/L (ref 3.5–5.3)
SODIUM: 141 meq/L (ref 135–145)
Total Bilirubin: 0.3 mg/dL (ref 0.2–1.2)
Total Protein: 6.3 g/dL (ref 6.0–8.3)

## 2015-01-16 LAB — LIPID PANEL
CHOLESTEROL: 218 mg/dL — AB (ref 0–200)
HDL: 59 mg/dL (ref >=46)
LDL Cholesterol: 131 mg/dL — ABNORMAL HIGH (ref 0–99)
TRIGLYCERIDES: 139 mg/dL (ref <150)
Total CHOL/HDL Ratio: 3.7 Ratio
VLDL: 28 mg/dL (ref 0–40)

## 2015-01-16 LAB — TSH: TSH: 0.224 u[IU]/mL — ABNORMAL LOW (ref 0.350–4.500)

## 2015-01-16 LAB — HEMOGLOBIN A1C
Hgb A1c MFr Bld: 5.2 % (ref <5.7)
Mean Plasma Glucose: 103 mg/dL (ref <117)

## 2015-01-16 MED ORDER — QUETIAPINE FUMARATE 300 MG PO TABS: 600.0000 mg | ORAL_TABLET | Freq: Every day | ORAL | Status: DC

## 2015-01-16 MED ORDER — CLONAZEPAM 0.5 MG PO TABS: 0.5000 mg | ORAL_TABLET | Freq: Two times a day (BID) | ORAL | Status: DC

## 2015-01-16 MED ORDER — PAROXETINE HCL 40 MG PO TABS: 40.0000 mg | ORAL_TABLET | Freq: Every day | ORAL | Status: DC

## 2015-01-16 NOTE — Progress Notes (Signed)
[name] [id number] Progress Note   Brittany SloughCarolyn S [name] [id number] 64 y.o.  01/16/2015 12:01 PM  Chief Complaint:  Medication management and follow-up.       History of Present Illness:  Brittany JonesCarolyn came for her follow-up appointment.    She is taking Seroquel, Paxil and Klonopin. She continues to have issues with her sister and sometimes she get very stressed out. He when she stopped the Lamictal she has rash however she was seen at urgent care and given antiallergy medication. She admitted noncompliant with her antiallergic medication but I explained that she can get over-the-counter. She feels the Seroquel is working very well. She still have irritability and anger but it is less intense and less frequent from the past.  She is able to sleep 6-7 hours at nighttime. She has no tremors or shakes. She does not asked for early refills of benzodiazepine. She's not taking Vistaril. I encouraged to see a therapist more frequently for coping skills.  Patient denies drinking or using any illegal substances.  Her appetite is okay.   Her pulse is slightly increased and her weight is increased from the past.  Suicidal Ideation: No Plan Formed: No Patient has means to carry out plan: No  Homicidal Ideation: No Plan Formed: No Patient has means to carry out plan: No  Past Psychiatric History/Hospitalization(s) Patient has history of bipolar disorder with severe mood swings, anger, impulsive behavior , mania and severe depression.  She had a seizures when her Klonopin was discontinued by her primary care physician.  She has seen in this office by physician assistant for many years and then she decided to see her primary care physician for medication management.  She has no inpatient psychiatric treatment.  We tried Lamictal but patient developed rash. Anxiety: Yes Bipolar Disorder: Yes Depression: Yes Mania: Yes Psychosis: No Schizophrenia: No Personality Disorder: No Hospitalization for  psychiatric illness: Patient was admitted because of seizures due to stopping Klonopin. History of Electroconvulsive Shock Therapy: No Prior Suicide Attempts: No  Medical History; The patient has GERD, osteoarthritis, hypothyroidism, polyps.  [name]  She is seeing a physician assistant Brittany NorwayBrittney [name].  Review of Systems  Constitutional: Negative.   Musculoskeletal: Positive for joint pain.  Neurological: Negative for dizziness and tremors.   Psychiatric: Agitation: No Hallucination: No Depressed Mood: No Insomnia: No Hypersomnia: No Altered Concentration: No Feels Worthless: No Grandiose Ideas: No Belief In Special Powers: No New/Increased Substance Abuse: No Compulsions: No  Neurologic: Headache: No Seizure: History of withdrawal seizures from benzodiazepine Paresthesias: No   Musculoskeletal: Strength & Muscle Tone: within normal limits Gait & Station: normal Patient leans: N/A   Outpatient Encounter Prescriptions as of 01/16/2015  Medication Sig  . albuterol (PROVENTIL HFA;VENTOLIN HFA) 108 (90 BASE) MCG/ACT inhaler Inhale 2 puffs into the lungs every 4 (four) hours as needed for wheezing or shortness of breath.  . cetirizine (ZYRTEC) 10 MG tablet Take 1 tablet (10 mg total) by mouth daily.  . clonazePAM (KLONOPIN) 0.5 MG tablet Take 1 tablet (0.5 mg total) by mouth 2 (two) times daily.  . hydrocortisone 2.5 % lotion Apply topically 2 (two) times daily as needed. For itching  . levothyroxine (SYNTHROID, LEVOTHROID) 100 MCG tablet Take 100 mcg by mouth daily before breakfast.  . meloxicam (MOBIC) 15 MG tablet Take 15 mg by mouth daily.  Marland Kitchen. PARoxetine (PAXIL) 40 MG tablet Take 1 tablet (40 mg total) by mouth daily.  . QUEtiapine (SEROQUEL) 300  MG tablet Take 2 tablets (600 mg total) by mouth at bedtime.  . ranitidine (ZANTAC) 150 MG capsule Take 1 capsule (150 mg total) by mouth 2 (two) times daily.  Marland Kitchen trimethoprim (TRIMPEX)  100 MG tablet Take 100 mg by mouth daily.  . [DISCONTINUED] clonazePAM (KLONOPIN) 0.5 MG tablet Take 1 tablet (0.5 mg total) by mouth 2 (two) times daily.  . [DISCONTINUED] PARoxetine (PAXIL) 40 MG tablet Take 1 tablet (40 mg total) by mouth daily.  . [DISCONTINUED] QUEtiapine (SEROQUEL) 300 MG tablet Take 2 tablets (600 mg total) by mouth at bedtime.   No facility-administered encounter medications on file as of 01/16/2015.    No results found for this or any previous visit (from the past 2160 hour(s)).    Constitutional:  BP 130/88 mmHg  Pulse 112  Ht 5' 6.5" (1.689 m)  Wt 161 lb (73.029 kg)  BMI 25.60 kg/m2   Mental Status Examination;  Patient is casually dressed and fairly groomed.  She maintains fair eye contact.  She is pleasant and cooperative.  She described her mood euthymic and her affect is appropriate.  She denies any auditory or visual hallucination.  She denies any active or passive suicidal thoughts or homicidal thoughts.  There were no delusions, paranoia or any obsessive thoughts.  Her speech is clear and fluent.  There are no flight of ideas or any loose association.  Her fund of knowledge is average.  Her attention and concentration is fair. Her thought process is logical and goal-directed. She has no tremors or shakes.  Her psychomotor activity is slightly increased.  She is alert and oriented x3.  Her insight judgment and impulse control is fair.   Established Problem, Stable/Improving (1), Review of Psycho-Social Stressors (1), Review or order clinical lab tests (1), Review of Last Therapy Session (1) and Review of Medication Regimen & Side Effects (2)  Assessment: Axis I: Bipolar disorder1, anxiety disorder, benzo abuse  Axis II: Deferred  Axis III:  Past Medical History  Diagnosis Date  . Asthma   . Depression   . GERD (gastroesophageal reflux disease)   . Inflammatory polyps of colon     pt notes flat and is to have surgery to remove   . Osteoarthritis  of both knees   . Hypothyroidism   . H/O hiatal hernia   . Seizure 03/06/2014    tonic clonic seizure which lasted about a minute  . Anxiety   . Bipolar disorder   . Benzodiazepine withdrawal with delirium 03/06/2014    Plan:   patient is fairly stable on her current medication.  I encouraged to see her primary care physician for her health needs.  We will order a CBC, CMP, hemoglobin A1c and TSH. I have noticed she has gained weight from the past.    Discusses weight maintenance and encouraged to do regular exercise and walking..  Patient does not want to change her Seroquel dosage or try something different.   I will continue Seroquel 600 mg at bedtime, Paxil 40 mg daily and Klonopin 0.5 mg twice a day.  Discussed medication side effects and benefits.  Encouraged to see therapist for coping skills.  Recommended to call us back if she has any question or any concern.  Follow-up in 2 months.  Addilyn Satterwhite T., MD 01/16/2015

## 2015-01-18 ENCOUNTER — Telehealth (HOSPITAL_COMMUNITY): Payer: Self-pay

## 2015-01-21 ENCOUNTER — Telehealth (HOSPITAL_COMMUNITY): Payer: Self-pay

## 2015-01-21 NOTE — Telephone Encounter (Signed)
I returned patient's phone call.  Discuss blood work results.  She has low TSH and slightly high cholesterol.  Otherwise her hemoglobin A1c, CBC and basic chemistry is normal.

## 2015-01-24 ENCOUNTER — Ambulatory Visit (HOSPITAL_COMMUNITY): Payer: Self-pay | Admitting: Psychology

## 2015-01-29 ENCOUNTER — Ambulatory Visit (INDEPENDENT_AMBULATORY_CARE_PROVIDER_SITE_OTHER): Payer: 59 | Admitting: Psychology

## 2015-01-29 DIAGNOSIS — F3132 Bipolar disorder, current episode depressed, moderate: Secondary | ICD-10-CM

## 2015-01-29 NOTE — Progress Notes (Signed)
   THERAPIST PROGRESS NOTE  Session Time: 12.30pm-1.03pm  Participation Level: Active  Behavioral Response: Well GroomedAlertDepressed  Type of Therapy: Individual Therapy  Treatment Goals addressed: Diagnosis: Bipolar and goal 1.  Interventions: Strength-based, Supportive and Other: self compassion  Summary: [name] is a 64 y.o. female who presents with depressed affect and report of feeling tired today.  Pt reported that today she has been worried that she would get upset as spending time w/ her sister who is bringing her to appointment then to laundry mat. Pt reported that last time she brought her to appointment she ended up crying as sister yelled at her. Pt reported that sister has mentioned that she needs assisted living. Pt reported that she is taking her medication as prescribed, able to clean and see to her needs.  Pt reports that at times doesn't have money to do things. Pt reports overall her mood has been good- is aware of little down today.  Pt was able to identify that she has internalized comments by sister in past (ie "stupid") and aware that this is not true. Pt was able to externalize interactions w/ her sister and focus that she does have positive encouraging relationship w/ others.     Suicidal/Homicidal: Nowithout intent/plan  Therapist Response: Assessed pt current functioning per pt report.  Processed w/pt her report of mood today and connection to her worries re: interactions w/ sister.  Explored w/pt internalizing messages from sister and assisted in reframing and externalizing.  Encouraged interactions and connections w/ positive supports system.   Plan: Return again in 3-4 weeks.  Diagnosis: Bipolar 1 D/O    Forde Radon, [facility] 01/29/2015

## 2015-03-01 ENCOUNTER — Ambulatory Visit (HOSPITAL_COMMUNITY): Payer: Self-pay | Admitting: Psychology

## 2015-03-07 ENCOUNTER — Ambulatory Visit (INDEPENDENT_AMBULATORY_CARE_PROVIDER_SITE_OTHER): Payer: 59 | Admitting: Psychology

## 2015-03-07 DIAGNOSIS — F3132 Bipolar disorder, current episode depressed, moderate: Secondary | ICD-10-CM | POA: Diagnosis not present

## 2015-03-07 NOTE — Progress Notes (Signed)
   THERAPIST PROGRESS NOTE  Session Time: 12.30pm-12.55pm  Participation Level: Active  Behavioral Response: Well GroomedAlertAFFECT WNL  Type of Therapy: Individual Therapy  Treatment Goals addressed: Diagnosis: Bipolar D/O and goal 1.  Interventions: Strength-based and Supportive  Summary: [name] is a 64 y.o. female who presents with affect WNL.  Pt reported mood has been ok- but today is suffering pain in her knees due to arthritis and report that physician declined renewing her meds.  Pt wasn't clear about reason not renewed (potential needs an appointment) and agreed best to communicate w/ physcian office and discuss what is alternative or next step to having renewed.  Pt reported no major conflicts with sister, but interactions remained strained- pt discussed how to resolve things doesn't respond to a lot of things sister says she feels is negative or inappropraite.  Pt reports that feels managing well and that would like to extend time between appointments.    Suicidal/Homicidal: Nowithout intent/plan  Therapist Response: Assessed pt current functioning per Pt report. Discussed pt pain in impact having today.  Encouraged pt to f/u w/ PCP re: barriers to renewing meds and how to proceed. Processed w/pt her interactions w/ sister and pt responses focus on resolving through own actions and not internalizing interactions.   Plan: Return again in 6 weeks.  Diagnosis: Bipolar 1 D/O    Forde Radon, [facility] 03/07/2015

## 2015-03-11 ENCOUNTER — Other Ambulatory Visit (HOSPITAL_COMMUNITY): Payer: Self-pay | Admitting: Psychiatry

## 2015-03-11 ENCOUNTER — Telehealth (HOSPITAL_COMMUNITY): Payer: Self-pay | Admitting: *Deleted

## 2015-03-11 DIAGNOSIS — F3132 Bipolar disorder, current episode depressed, moderate: Secondary | ICD-10-CM

## 2015-03-11 MED ORDER — QUETIAPINE FUMARATE 300 MG PO TABS: 600.0000 mg | ORAL_TABLET | Freq: Every day | ORAL | Status: DC

## 2015-03-11 NOTE — Telephone Encounter (Signed)
PT called for a refill for Seroquel . Per Dr. Lolly Mustache, pt is authorized for a refill for Seroquel , qty 15. Called and informed refill is for 1 week. Pt has a f/u appt on 8/8. PT verbalizes understanding.

## 2015-03-11 NOTE — Telephone Encounter (Signed)
PT called for a refill for Seroquel . Per Dr. Florentina Jenny, pt is authorized for a refill for Seroquel , qty 15. Called and informed pt the refill in only for 1 week. Pt verbalizes understanding.  PT has a f/u appt on 8/8.

## 2015-03-19 ENCOUNTER — Other Ambulatory Visit (HOSPITAL_COMMUNITY): Payer: Self-pay | Admitting: Psychiatry

## 2015-03-19 ENCOUNTER — Ambulatory Visit (INDEPENDENT_AMBULATORY_CARE_PROVIDER_SITE_OTHER): Payer: 59 | Admitting: Psychiatry

## 2015-03-19 ENCOUNTER — Encounter (HOSPITAL_COMMUNITY): Payer: Self-pay | Admitting: Psychiatry

## 2015-03-19 VITALS — BP 122/84 | HR 100 | Ht 66.5 in | Wt 158.4 lb

## 2015-03-19 DIAGNOSIS — F3132 Bipolar disorder, current episode depressed, moderate: Secondary | ICD-10-CM

## 2015-03-19 DIAGNOSIS — F319 Bipolar disorder, unspecified: Secondary | ICD-10-CM | POA: Diagnosis not present

## 2015-03-19 DIAGNOSIS — F1310 Sedative, hypnotic or anxiolytic abuse, uncomplicated: Secondary | ICD-10-CM

## 2015-03-19 DIAGNOSIS — F419 Anxiety disorder, unspecified: Secondary | ICD-10-CM | POA: Diagnosis not present

## 2015-03-19 MED ORDER — CLONAZEPAM 0.5 MG PO TABS: 0.5000 mg | ORAL_TABLET | Freq: Two times a day (BID) | ORAL | Status: DC

## 2015-03-19 MED ORDER — PAROXETINE HCL 40 MG PO TABS: 40.0000 mg | ORAL_TABLET | Freq: Every day | ORAL | Status: DC

## 2015-03-19 MED ORDER — QUETIAPINE FUMARATE 300 MG PO TABS: 600.0000 mg | ORAL_TABLET | Freq: Every day | ORAL | Status: DC

## 2015-03-19 NOTE — Progress Notes (Signed)
[facility] [phone] Progress Note   [name] [id number] 64 y.o.  03/19/2015 10:35 AM  Chief Complaint:  Medication management and follow-up.       History of Present Illness:  [name] came for her follow-up appointment.  She is taking taking her medication and denies any side effects.  Lately she is complaining of joint pain and she does not feel her current pain medicine is working very well.  She is scheduled to see her primary care physician for pain management.  She like Seroquel Paxil and Klonopin.  She denies any major panic attack.  She still have issues with her sister however there has no recent verbal arguments or any anger issues.  Patient sleeps good with the Seroquel.  She has no tremors or shakes.  She admitted her irritability anger and mood swings are less intense and less frequent since she is taking Seroquel.  Her appetite is okay.  She lost 3 pounds from the last visit.  Patient denies drinking or using any illegal substances.  She has blood work done which shows low TSH and high cholesterol.  Her CBC and basic chemistry and hemoglobin A1c is normal.  Patient lives with her sister.  Patient denies any paranoia or any hallucination.  Suicidal Ideation: No Plan Formed: No Patient has means to carry out plan: No  Homicidal Ideation: No Plan Formed: No Patient has means to carry out plan: No  Past Psychiatric History/Hospitalization(s) Patient has history of bipolar disorder with severe mood swings, anger, impulsive behavior , mania and severe depression.  She had a seizures when her Klonopin was discontinued by her primary care physician.  She has seen in this office by physician assistant for many years and then she decided to see her primary care physician for medication management.  She has no inpatient psychiatric treatment.  We tried Lamictal but patient developed rash. Anxiety: Yes Bipolar Disorder: Yes Depression: Yes Mania: Yes Psychosis:  No Schizophrenia: No Personality Disorder: No Hospitalization for psychiatric illness: Patient was admitted because of seizures due to stopping Klonopin. History of Electroconvulsive Shock Therapy: No Prior Suicide Attempts: No  Medical History; The patient has GERD, osteoarthritis, hypothyroidism, polyps.  Her primary care physician is [facility].  She is seeing a physician assistant Veleta Miners.  Review of Systems  Constitutional: Negative.   Musculoskeletal: Positive for joint pain.  Neurological: Negative for dizziness and tremors.   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 albuterol (PROVENTIL HFA;VENTOLIN HFA) 108 (90 BASE) MCG/ACT inhaler Inhale 2 puffs into the lungs every 4 (four) hours as needed for wheezing or shortness of breath.  . cetirizine (ZYRTEC) 10 MG tablet Take 1 tablet (10 mg total) by mouth daily.  . clonazePAM (KLONOPIN) 0.5 MG tablet Take 1 tablet (0.5 mg total) by mouth 2 (two) times daily.  . hydrocortisone 2.5 % lotion Apply topically 2 (two) times daily as needed. For itching  . levothyroxine (SYNTHROID, LEVOTHROID) 100 MCG tablet Take 100 mcg by mouth daily before breakfast.  . meloxicam (MOBIC) 15 MG tablet Take 15 mg by mouth daily.  Marland Kitchen  PARoxetine (PAXIL) 40 MG tablet Take 1 tablet (40 mg total) by mouth daily.  . QUEtiapine (SEROQUEL) 300 MG tablet Take 2 tablets (600 mg total) by mouth at bedtime.  . ranitidine (ZANTAC) 150 MG capsule Take 1 capsule  (150 mg total) by mouth 2 (two) times daily.  Marland Kitchen trimethoprim (TRIMPEX) 100 MG tablet Take 100 mg by mouth daily.  . [DISCONTINUED] clonazePAM (KLONOPIN) 0.5 MG tablet Take 1 tablet (0.5 mg total) by mouth 2 (two) times daily.  . [DISCONTINUED] PARoxetine (PAXIL) 40 MG tablet Take 1 tablet (40 mg total) by mouth daily.  . [DISCONTINUED] QUEtiapine (SEROQUEL) 300 MG tablet Take 2 tablets (600 mg total) by mouth at bedtime.   No facility-administered encounter medications on file as of 03/19/2015.    Recent Results (from the past 2160 hour(s))  TSH     Status: Abnormal   Collection Time: 01/16/15 11:52 AM  Result Value Ref Range   TSH 0.224 (L) 0.350 - 4.500 uIU/mL  CBC with Differential/Platelet     Status: Abnormal   Collection Time: 01/16/15 11:52 AM  Result Value Ref Range   WBC 5.1 4.0 - 10.5 K/uL   RBC 4.31 3.87 - 5.11 MIL/uL   Hemoglobin 13.4 12.0 - 15.0 g/dL   HCT 41.6 36.0 - 46.0 %   MCV 96.5 78.0 - 100.0 fL   MCH 31.1 26.0 - 34.0 pg   MCHC 32.2 30.0 - 36.0 g/dL   RDW 15.0 11.5 - 15.5 %   Platelets 299 150 - 400 K/uL   MPV 9.6 8.6 - 12.4 fL   Neutrophils Relative % 51 43 - 77 %   Neutro Abs 2.6 1.7 - 7.7 K/uL   Lymphocytes Relative 34 12 - 46 %   Lymphs Abs 1.7 0.7 - 4.0 K/uL   Monocytes Relative 7 3 - 12 %   Monocytes Absolute 0.4 0.1 - 1.0 K/uL   Eosinophils Relative 7 (H) 0 - 5 %   Eosinophils Absolute 0.4 0.0 - 0.7 K/uL   Basophils Relative 1 0 - 1 %   Basophils Absolute 0.1 0.0 - 0.1 K/uL   Smear Review Criteria for review not met   COMPLETE METABOLIC PANEL WITH GFR     Status: Abnormal   Collection Time: 01/16/15 11:52 AM  Result Value Ref Range   Sodium 141 135 - 145 mEq/L   Potassium 4.8 3.5 - 5.3 mEq/L   Chloride 108 96 - 112 mEq/L   CO2 24 19 - 32 mEq/L   Glucose, Bld 74 70 - 99 mg/dL   BUN 8 6 - 23 mg/dL   Creat 0.93 0.50 - 1.10 mg/dL   Total Bilirubin 0.3 0.2 - 1.2 mg/dL   Alkaline Phosphatase 118 (H) 39 - 117 U/L   AST 13 0 - 37 U/L   ALT 10 0 - 35  U/L   Total Protein 6.3 6.0 - 8.3 g/dL   Albumin 4.0 3.5 - 5.2 g/dL   Calcium 9.2 8.4 - 10.5 mg/dL   GFR, Est African American 76 mL/min   GFR, Est Non African American 66 mL/min    Comment:   The estimated GFR is a calculation valid for adults (>=53 years old) that uses the CKD-EPI algorithm to adjust for age and sex. It is   not to be used for children, pregnant women, hospitalized patients,    patients on dialysis, or with rapidly changing kidney function. According to the NKDEP, eGFR >89 is normal, 60-89 shows mild impairment, 30-59  shows moderate impairment, 15-29 shows severe impairment and <15 is ESRD.     Hemoglobin A1c     Status: None   Collection Time: 01/16/15 11:52 AM  Result Value Ref Range   Hgb A1c MFr Bld 5.2 <5.7 %    Comment:                                                                        According to the ADA Clinical Practice Recommendations for 2011, when HbA1c is used as a screening test:     >=6.5%   Diagnostic of Diabetes Mellitus            (if abnormal result is confirmed)   5.7-6.4%   Increased risk of developing Diabetes Mellitus   References:Diagnosis and Classification of Diabetes Mellitus,Diabetes XUXY,3338,32(NVBTY 1):S62-S69 and Standards of Medical Care in         Diabetes - 2011,Diabetes Care,2011,34 (Suppl 1):S11-S61.      Mean Plasma Glucose 103 <117 mg/dL  Lipid panel     Status: Abnormal   Collection Time: 01/16/15 11:52 AM  Result Value Ref Range   Cholesterol 218 (H) 0 - 200 mg/dL    Comment: ATP III Classification:       < 200        mg/dL        Desirable      200 - 239     mg/dL        Borderline High      >= 240        mg/dL        High      Triglycerides 139 <150 mg/dL   HDL 59 >=46 mg/dL    Comment: ** Please note change in reference range(s). **   Total CHOL/HDL Ratio 3.7 Ratio   VLDL 28 0 - 40 mg/dL   LDL Cholesterol 131 (H) 0 - 99 mg/dL    Comment:   Total Cholesterol/HDL Ratio:CHD Risk                         Coronary Heart Disease Risk Table                                        Men       Women          1/2 Average Risk              3.4        3.3              Average Risk              5.0        4.4           2X Average Risk              9.6        7.1           3X Average Risk             23.4       11.0 Use the  calculated Patient Ratio above and the CHD Risk table  to determine the patient's CHD Risk. ATP III Classification (LDL):       < 100        mg/dL         Optimal      100 - 129     mg/dL         Near or Above Optimal      130 - 159     mg/dL         Borderline High      160 - 189     mg/dL         High       > 190        mg/dL         Very High         Constitutional:  BP 122/84 mmHg  Pulse 100  Ht 5' 6.5" (1.689 m)  Wt 158 lb 6.4 oz (71.85 kg)  BMI 25.19 kg/m2   Mental Status Examination;  Patient is casually dressed and fairly groomed.  She maintains fair eye contact.  She is pleasant and cooperative.  She described her mood euthymic and her affect is appropriate.  She denies any auditory or visual hallucination.  She denies any active or passive suicidal thoughts or homicidal thoughts.  There were no delusions, paranoia or any obsessive thoughts.  Her speech is clear and fluent.  There are no flight of ideas or any loose association.  Her fund of knowledge is average.  Her attention and concentration is fair. Her thought process is logical and goal-directed. She has no tremors or shakes.  Her psychomotor activity is slightly increased.  She is alert and oriented x3.  Her insight judgment and impulse control is fair.   Established Problem, Stable/Improving (1), Review of Psycho-Social Stressors (1), Review or order clinical lab tests (1), Review of Last Therapy Session (1) and Review of Medication Regimen & Side Effects (2)  Assessment: Axis I: Bipolar disorder1, anxiety disorder, benzo abuse  Axis II: Deferred  Axis III:  Past Medical History  Diagnosis Date  .  Asthma   . Depression   . GERD (gastroesophageal reflux disease)   . Inflammatory polyps of colon     pt notes flat and is to have surgery to remove   . Osteoarthritis of both knees   . Hypothyroidism   . H/O hiatal hernia   . Seizure 03/06/2014    tonic clonic seizure which lasted about a minute  . Anxiety   . Bipolar disorder   . Benzodiazepine withdrawal with delirium 03/06/2014    Plan:  Patient is a stable on her current medication.  I discuss blood work results with her.  I encouraged to see her primary care physician for her low TSH and high cholesterol.  She is going to see her primary care physician for the management of pain.  I will continue Seroquel 600 mg at bedtime, Paxil 40 mg daily and Klonopin 0.5 mg twice a day.  Discussed benzodiazepine dependence, tolerance and withdrawal.  At this time she does not have any tremors shakes or any EPS.  Recommended to call us back if she has any question or any concern.  Follow-up in 3 months.  Oswin Griffith T., MD 03/19/2015

## 2015-04-07 ENCOUNTER — Other Ambulatory Visit (HOSPITAL_COMMUNITY): Payer: Self-pay | Admitting: Psychiatry

## 2015-04-08 ENCOUNTER — Other Ambulatory Visit (HOSPITAL_COMMUNITY): Payer: Self-pay | Admitting: Psychiatry

## 2015-04-08 NOTE — Telephone Encounter (Signed)
Given on 03/19/15 with 2 refills.

## 2015-04-11 ENCOUNTER — Other Ambulatory Visit (HOSPITAL_COMMUNITY): Payer: Self-pay | Admitting: Psychiatry

## 2015-04-12 ENCOUNTER — Other Ambulatory Visit (HOSPITAL_COMMUNITY): Payer: Self-pay | Admitting: Psychiatry

## 2015-04-15 ENCOUNTER — Other Ambulatory Visit (HOSPITAL_COMMUNITY): Payer: Self-pay | Admitting: Psychiatry

## 2015-04-16 ENCOUNTER — Telehealth (HOSPITAL_COMMUNITY): Payer: Self-pay

## 2015-04-16 DIAGNOSIS — F3132 Bipolar disorder, current episode depressed, moderate: Secondary | ICD-10-CM

## 2015-04-16 MED ORDER — QUETIAPINE FUMARATE 300 MG PO TABS: 600.0000 mg | ORAL_TABLET | Freq: Every day | ORAL | Status: DC

## 2015-04-16 NOTE — Telephone Encounter (Signed)
Medication refill request - Telephone call with patient stating her [facility] on [address] is telling her they never received orders from 03/19/15.  Spoke with Zollie Scale, [facility]ist at [facility] on [address] to give phone orders as reports not received.   New Seroquel order authorized by Dr. Lolly Mustache to give correct quantity of #60 as patient takes two at bedtime of .

## 2015-04-18 ENCOUNTER — Ambulatory Visit (INDEPENDENT_AMBULATORY_CARE_PROVIDER_SITE_OTHER): Payer: 59 | Admitting: Psychology

## 2015-04-18 DIAGNOSIS — F319 Bipolar disorder, unspecified: Secondary | ICD-10-CM | POA: Diagnosis not present

## 2015-04-18 NOTE — Progress Notes (Signed)
   THERAPIST PROGRESS NOTE  Session Time: 10.55am-11.25am  Participation Level: Active  Behavioral Response: Well GroomedAlertDepressed  Type of Therapy: Individual Therapy  Treatment Goals addressed: Diagnosis: Bipolar1 D/O and coping w/ grief  Interventions: CBT and Supportive  Summary: [name] is a 64 y.o. female who presents with affect congruent w/ her report of feeling of grief and lonely. Pt reported that she hasn't had any panic, no anger issues and less depressed.  Pt reports she has been painting in her home and feeling that accomplishing things.  Pt reported that her sister still seems very disinterest in being emotionally supportive.  Pt reports her family doesn't come to visit and doesn't call often.  Pt does reports that doesn't feel she has anyone she can talk to about her grief of her brother and sister in law.  Pt is bothered that she hasn't cried about her sister in law since her death in Jan 25, 2016as she was very close to her. Pt discussed her cats.    Suicidal/Homicidal: Nowithout intent/plan  Therapist Response: Assessed pt current functioning per pt report.  Processed w/pt her mood and her interactions w/ family.  Discussed grieving and not crying doesn't mean not mourning loss of loved one.  Encouraged pt to talk about her feelings and memories and who is in her life to share this with.  Discussed pets as companions.   Plan: Return again in 2 months.  Diagnosis: bipolar 1, depressed, mild    Yang Rack, LPC 04/18/2015

## 2015-05-17 ENCOUNTER — Telehealth (HOSPITAL_COMMUNITY): Payer: Self-pay

## 2015-05-17 DIAGNOSIS — F3132 Bipolar disorder, current episode depressed, moderate: Secondary | ICD-10-CM

## 2015-05-17 NOTE — Telephone Encounter (Signed)
Medication refill request received by fax to change patient to a 90 day order for Paroxetine.

## 2015-05-20 ENCOUNTER — Telehealth (HOSPITAL_COMMUNITY): Payer: Self-pay

## 2015-05-20 MED ORDER — PAROXETINE HCL 40 MG PO TABS: 40.0000 mg | ORAL_TABLET | Freq: Every day | ORAL | Status: DC

## 2015-05-20 NOTE — Telephone Encounter (Signed)
Medication management - Telephone call with patient to follow up on her message stating she was currently experiencing more anxiety and is worried about having a colonoscopy in the coming week. Patient states the Klonpin at twice a day "just isn't cutting it" and requests to increase in klonopin to three times a day.  Patient states she feels she needs an increase at this point to help with the increased anxiety she if currently experiencing.  Informed patient this nurse would send request to Dr. Lolly Mustache but we may have to schedule an earlier appointment for her to return to discuss further but will send to Dr. Lolly Mustache for an answer and will call patient back once discussed with Dr. Lolly Mustache.

## 2015-05-20 NOTE — Telephone Encounter (Signed)
She has history of benzo abuse. No more increase in benzo

## 2015-05-20 NOTE — Addendum Note (Signed)
Addended by: Wilder Glade on: 05/20/2015 03:15 PM   Modules accepted: Orders

## 2015-05-20 NOTE — Telephone Encounter (Signed)
Called patient back to inform Dr. Lolly Mustache did not feel comfortable increasing patient's prescribed Klonopin due to concerns for the addictiveness of the medication and discussed need to use breathing exercises and other skills learned to help manage periods of increased anxiety.  Informed a new 90 day order for her Paxil was e-scribed to her pharmacy as this was approved by Dr. Lolly Mustache today.  Requested if patient continued to have problems with managing anxiety to contact us back to attempt to schedule earlier for an evaluation if felt may be necessary and patient agreed with plan.  Patient denied need at this time or crisis a this point.

## 2015-05-20 NOTE — Telephone Encounter (Signed)
Met with Dr.Arfeen who approved a 90 day order of patient's Paroxetine 71m, one a day [facility] [address] e-scribed as requested.

## 2015-06-11 IMAGING — MG MM RT BREAST BX W LOC DEV 1ST LESION IMAGE BX SPEC STEREO GUIDE
1 series · 1 of 1 positions shown · non-contrast
Comparison: Previous exams.

ADDENDUM:
Pathology revealed benign breast tissue with microcalcifications and
sclerosing adenosis and fibrocystic change in the right breast. This
was found to be concordant by Dr. Lhabibe Riya. Pathology was
discussed with the patient by telephone. She reported doing well
after the biopsy with tenderness at the site. Post biopsy
instructions were reviewed and her questions were answered. She was
encouraged to call The [REDACTED] for any
additional concerns. She was asked to return for diagnostic right
mammography in 6 months.

Pathology results reported by Batya Parchment RN, BSN on May 21, 2014.
CLINICAL DATA: Patient presents for stereotactic guided core biopsy
of right breast calcifications.
EXAM:
RIGHT BREAST STEREOTACTIC CORE NEEDLE BIOPSY

[R SPECIMEN]
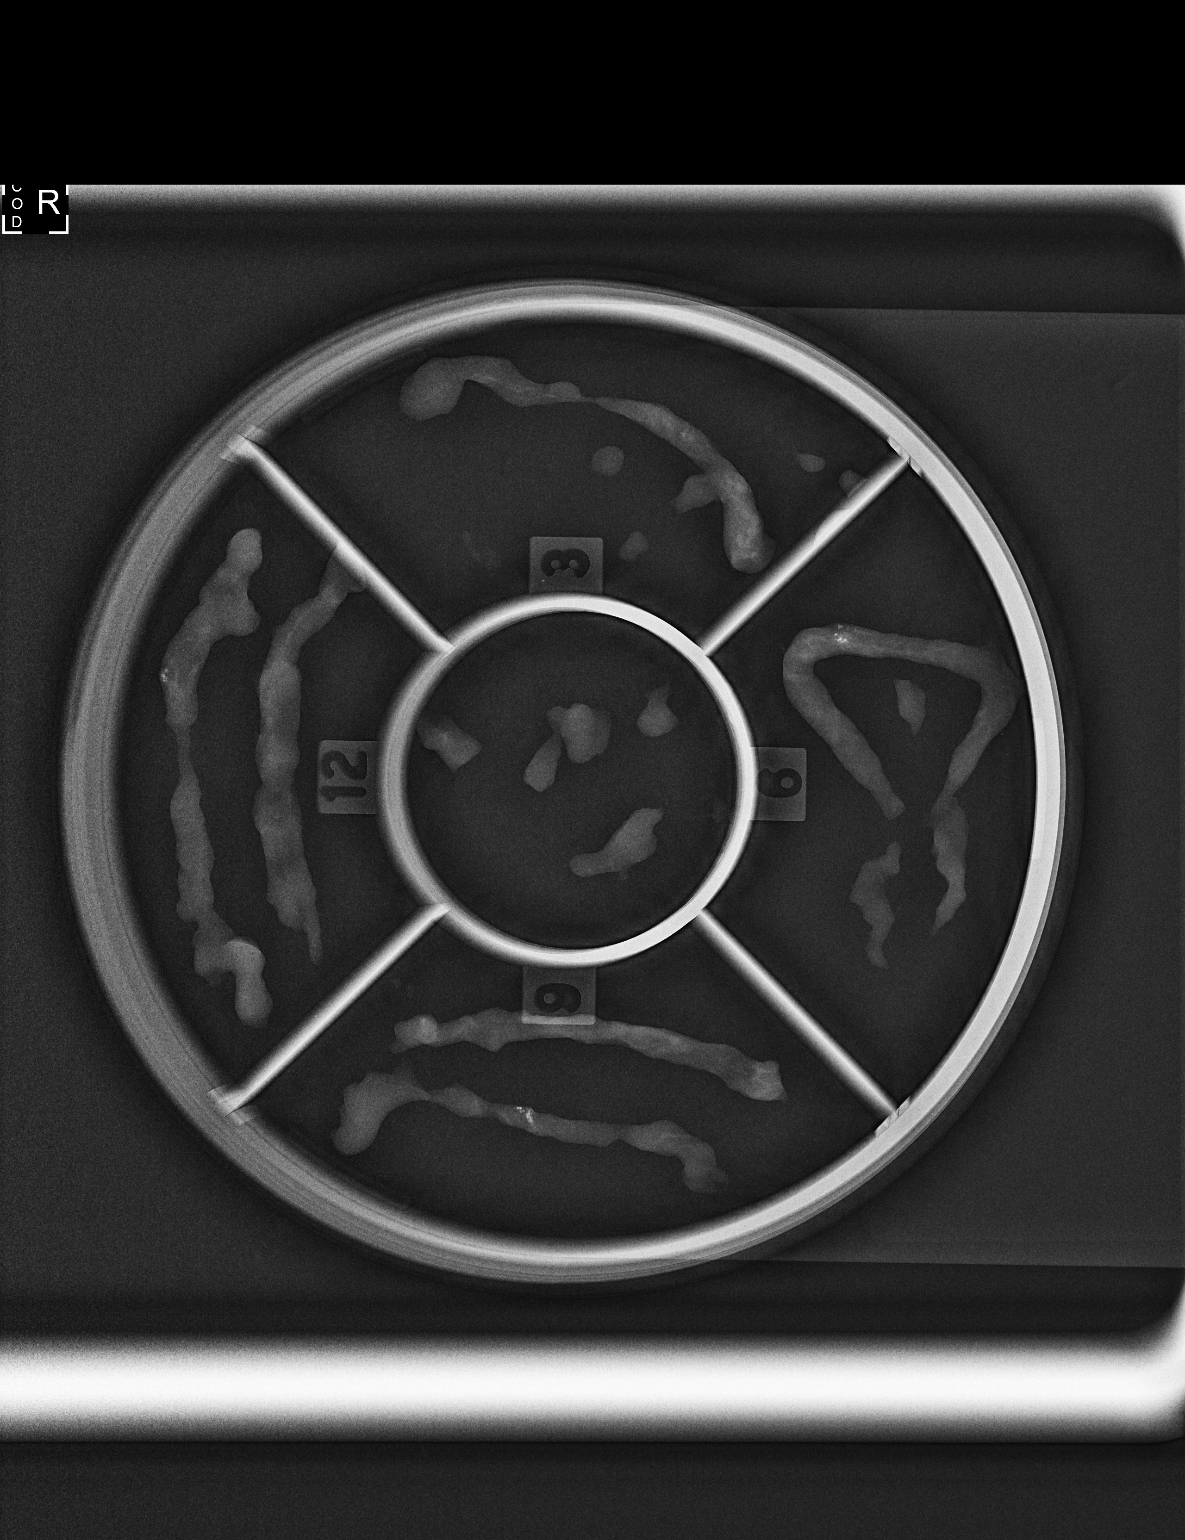

[1 of 1 positions shown; findings below may reference images not displayed]



Using sterile technique and 2% Lidocaine as local anesthetic, under
stereotactic guidance, a 9 gauge vacuum assisted device was used to
perform core needle biopsy of calcifications in the upper inner
quadrant of the right breast using a medial approach. Specimen
radiograph was performed showing calcifications to be present.
Specimens with calcifications are identified for pathology.

At the conclusion of the procedure, an X shaped tissue marker clip
was deployed into the biopsy cavity. Follow-up 2-view mammogram
confirmed clip in expected location.
IMPRESSION: Stereotactic-guided biopsy of right breast calcifications. No
apparent complications.

## 2015-06-19 ENCOUNTER — Ambulatory Visit (INDEPENDENT_AMBULATORY_CARE_PROVIDER_SITE_OTHER): Payer: 59 | Admitting: Psychiatry

## 2015-06-19 ENCOUNTER — Encounter (HOSPITAL_COMMUNITY): Payer: Self-pay | Admitting: Psychiatry

## 2015-06-19 VITALS — BP 135/93 | HR 102 | Ht 66.5 in | Wt 157.4 lb

## 2015-06-19 DIAGNOSIS — F419 Anxiety disorder, unspecified: Secondary | ICD-10-CM

## 2015-06-19 DIAGNOSIS — F3132 Bipolar disorder, current episode depressed, moderate: Secondary | ICD-10-CM | POA: Diagnosis not present

## 2015-06-19 DIAGNOSIS — F1310 Sedative, hypnotic or anxiolytic abuse, uncomplicated: Secondary | ICD-10-CM

## 2015-06-19 MED ORDER — CLONAZEPAM 0.5 MG PO TABS: 0.5000 mg | ORAL_TABLET | Freq: Two times a day (BID) | ORAL | Status: AC

## 2015-06-19 MED ORDER — QUETIAPINE FUMARATE 300 MG PO TABS: 600.0000 mg | ORAL_TABLET | Freq: Every day | ORAL | Status: DC

## 2015-06-19 NOTE — Progress Notes (Signed)
[name] [id number] 64 y.o.  06/19/2015 2:10 PM  Chief Complaint:  Medication management and follow-up.       History of Present Illness:  [name] came for her follow-up appointment.  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  Otherwise she described her mood has been stable.  She denies any irritability, anger, mood swing.  She sleeping good.  She is hoping to visit [place] to see her daughter in few days.  Recently she's seen her primary care physician who adjusted her thyroid medication.  Patient denies any crying spells, mania, psychosis or any hallucination.  She denies any feeling of hopelessness or worthlessness.  She is not engaged in any self abusive behavior or any aggression.  She is taking Seroquel, Klonopin and Paxil.  She has no tremors, shakes or any EPS.  Her appetite is okay.  Her vitals are stable.  Patient denies drinking or using any illegal substances.  Suicidal Ideation: No Plan Formed: No Patient has means to carry out plan: No  Homicidal Ideation: No Plan Formed: No Patient has means to carry out plan: No  Past Psychiatric History/Hospitalization(s) Patient has history of bipolar disorder with severe mood swings, anger, impulsive behavior, mania and severe depression.  She had a seizures when her Klonopin was discontinued by her primary care physician.  She has seen in this office by physician assistant for many years and then she decided to see her primary care physician for medication management.  She has no inpatient psychiatric treatment.  We tried Lamictal but patient developed rash. Anxiety: Yes Bipolar Disorder: Yes Depression: Yes Mania: Yes Psychosis: No Schizophrenia: No Personality Disorder: No Hospitalization for psychiatric illness: Patient was admitted because of seizures due to stopping Klonopin. History of Electroconvulsive Shock Therapy: No Prior  Suicide Attempts: No  Medical History; The patient has GERD, osteoarthritis, hypothyroidism, polyps.  [facility]  She is seeing a physician assistant Benson NorwayBrittney [name].  Review of Systems  Constitutional: Negative.   Musculoskeletal: Positive for joint pain.  Skin: Negative for itching and rash.  Neurological: Positive for headaches. Negative for dizziness and tremors.  Psychiatric/Behavioral: Negative for suicidal ideas.   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 cetirizine (ZYRTEC) 10 MG tablet Take 1 tablet (10 mg total) by mouth daily.  . clonazePAM (KLONOPIN) 0.5 MG tablet Take 1 tablet (0.5 mg total) by mouth 2 (two) times daily.  . hydrocortisone 2.5 % lotion Apply topically 2 (two) times daily as needed. For itching  . levothyroxine (SYNTHROID, LEVOTHROID) 88 MCG tablet 1 TABLET BY MOUTH DAILY ON EMPTY STOMACH  . meloxicam (MOBIC) 15 MG tablet Take 15 mg by mouth daily.  Marland Kitchen. PARoxetine (PAXIL) 40 MG tablet Take 1 tablet (40 mg total) by mouth daily.  . QUEtiapine (SEROQUEL) 300 MG tablet Take 2 tablets (600 mg total) by mouth at bedtime.  . ranitidine (ZANTAC) 150 MG capsule Take 1 capsule (150 mg total) by mouth 2 (two) times daily.  . [DISCONTINUED]  albuterol (PROVENTIL HFA;VENTOLIN HFA) 108 (90 BASE) MCG/ACT inhaler Inhale 2 puffs into the lungs every 4 (four) hours as needed for wheezing or shortness of breath.  . [DISCONTINUED] clonazePAM (KLONOPIN)  0.5 MG tablet Take 1 tablet (0.5 mg total) by mouth 2 (two) times daily.  . [DISCONTINUED] levothyroxine (SYNTHROID, LEVOTHROID) 100 MCG tablet Take 100 mcg by mouth daily before breakfast.  . [DISCONTINUED] QUEtiapine (SEROQUEL) 300 MG tablet Take 2 tablets (600 mg total) by mouth at bedtime.  . [DISCONTINUED] trimethoprim (TRIMPEX) 100 MG tablet Take 100 mg by mouth daily.   No facility-administered encounter medications on file as of 06/19/2015.    No results found for this or any previous visit (from the past 2160 hour(s)).    Constitutional:  BP 135/93 mmHg  Pulse 102  Ht 5' 6.5" (1.689 m)  Wt 157 lb 6.4 oz (71.396 kg)  BMI 25.03 kg/m2   Mental Status Examination;  Patient is casually dressed and fairly groomed.  She maintains fair eye contact.  She is pleasant and cooperative.  She described her mood euthymic and her affect is appropriate.  She denies any auditory or visual hallucination.  She denies any active or passive suicidal thoughts or homicidal thoughts.  There were no delusions, paranoia or any obsessive thoughts.  Her speech is clear and fluent.  There are no flight of ideas or any loose association.  Her fund of knowledge is average.  Her attention and concentration is fair. Her thought process is logical and goal-directed. She has no tremors or shakes.  Her psychomotor activity is slightly increased.  She is alert and oriented x3.  Her insight judgment and impulse control is fair.   Established Problem, Stable/Improving (1), Review of Last Therapy Session (1) and Review of Medication Regimen & Side Effects (2)  Assessment: Axis I: Bipolar disorder1, anxiety disorder, benzo abuse  Axis II: Deferred  Axis III:  Past Medical History  Diagnosis Date  . Asthma   . Depression   . GERD (gastroesophageal reflux disease)   . Inflammatory polyps of colon (HCC)     pt notes flat and is to have surgery to remove   . Osteoarthritis of both knees   . Hypothyroidism   . H/O  hiatal hernia   . Seizure (HCC) 03/06/2014    tonic clonic seizure which lasted about a minute  . Anxiety   . Bipolar disorder (HCC)   . Benzodiazepine withdrawal with delirium (HCC) 03/06/2014    Plan:  Patient is a stable on her current medication.  She has no side effects.  She see her primary care physician for the management of chronic pain. I will continue Seroquel 600 mg at bedtime, Paxil 40 mg daily and Klonopin 0.5 mg twice a day.  Discussed benzodiazepine dependence, tolerance and withdrawal.  At this time she does not have any tremors shakes or any EPS.  Recommended to call us back if she has any question or any concern.  Follow-up in 3 months.  Phinley Schall T., MD 06/19/2015

## 2015-06-24 ENCOUNTER — Ambulatory Visit (HOSPITAL_COMMUNITY): Payer: Self-pay | Admitting: Psychology

## 2015-06-25 ENCOUNTER — Ambulatory Visit (HOSPITAL_COMMUNITY): Payer: Self-pay | Admitting: Psychology

## 2015-07-04 ENCOUNTER — Other Ambulatory Visit (HOSPITAL_COMMUNITY): Payer: Self-pay | Admitting: Psychiatry

## 2015-07-07 ENCOUNTER — Other Ambulatory Visit (HOSPITAL_COMMUNITY): Payer: Self-pay | Admitting: Psychiatry

## 2015-07-08 ENCOUNTER — Ambulatory Visit (HOSPITAL_COMMUNITY): Payer: Self-pay | Admitting: Psychology

## 2015-07-16 ENCOUNTER — Ambulatory Visit (INDEPENDENT_AMBULATORY_CARE_PROVIDER_SITE_OTHER): Payer: 59 | Admitting: Psychology

## 2015-07-16 ENCOUNTER — Telehealth (HOSPITAL_COMMUNITY): Payer: Self-pay

## 2015-07-16 DIAGNOSIS — F319 Bipolar disorder, unspecified: Secondary | ICD-10-CM

## 2015-07-16 NOTE — Telephone Encounter (Signed)
Medication management - Pt. in for therapy today and reported several days she took one extra Klonopin and only has 2 days remaining but cannot fill until Saturday 07/20/15.  Agreed to send request to [name] to allow medication to be filled early and Forde RadonLeanne Yates, therapist informed patient her insurance still may not allow medication to be filled early.  Agreed to question if [name] would allow medication to be filled 2 days early.

## 2015-07-16 NOTE — Progress Notes (Signed)
   THERAPIST PROGRESS NOTE  Session Time: 12.30pm-1.08pm  Participation Level: Active  Behavioral Response: Well GroomedAlertWorry  Type of Therapy: Individual Therapy  Treatment Goals addressed: Diagnosis: bipolar 1 D/O and goal 1.  Interventions: CBT and Supportive  Summary: Brittany SloughCarolyn S Folmar is a 64 y.o. female who presents with report of feeling anxious.  Pt reported that she feels some anxiety today w/ sister transporting her.  Pt reported last week- when she was driving her somewhere sister got mad and starting yelling and cursing at her.  Pt reported that she asked her not to yell and to let her out- but sister wouldn't.  Pt reported she got out of the car when she got to her house.  Pt reported that she did enjoy Thanksgiving w/ her siblings/family meeting at her sister's.  Pt did express some grief w/ holidays and losses of loved ones over the past couple of years.  Pt expressed worry about her klonopin- stating she was short pills as had taken extra some days in past month.  Pt reports pharmacy informed can't fill till 07/20/15 and she only has 2 pills left.  Pt denied any other upcoming stressors in next couple of days.     Suicidal/Homicidal: Nowithout intent/plan  Therapist Response: Assessed pt current functioning per pt report.  Processed w/pt her anxiety feeling today and potential triggers- discussed coping skills.  Explored grief and ways to feel connected at the holidays and spending time w/ family again as she did at Thanksgiving as reported was positive.  Encouraged pt to plan for relaxing activities and positive distractions over next couple of days.    Plan: Return again in 4 weeks. F/u w/ dr. Florentina Jennyafreen as scheduled.  Informed Everlene BallsShawn Taylor of pt being short on medication- he reviewed and will inform [name] so he can decided how to handle.  Shawn reiterated insurance likely won't pay even if order written until refill date.  Informed [name] may recommend spacing out  medication and didn't feel w/ pt dose that would experience any medical withdrawal symptoms.  Pt was informed.   Diagnosis: Bipolar 1 D/O   Forde RadonYATES,Teague Goynes, [facility] 07/16/2015

## 2015-07-16 NOTE — Telephone Encounter (Signed)
No early refills of benzo.

## 2015-07-17 NOTE — Telephone Encounter (Signed)
Telephone call with patient to follow up on her request to fill Clonazepam medication early.  Informed patient this nurse discussed with Dr.Arfeen two times and her concern for potential withdrawal.  Informed [name] instructed patient to go to emergency if any symptoms of feeling different or aura but unlikely would have withdrawal symptoms or seizure activity due to low dosage of current Clonazepam.  Patient reported plan to take last remaining pill on 07/18/15 as is spacing them out until she can fill 07/20/15 AM.  Patient to call back if any problems prior to that time and stated understanding [name] instructed patient to only take Clonazepam as directed, no taking more than prescribed daily and no early refills or patient may be discharged from services due to non-adherence with medical recommendations.   Patient stated understanding and reports feeling fine at this time and will call back if any problems.

## 2015-08-12 ENCOUNTER — Emergency Department (INDEPENDENT_AMBULATORY_CARE_PROVIDER_SITE_OTHER)
Admission: EM | Admit: 2015-08-12 | Discharge: 2015-08-12 | Disposition: A | Discharge status: Home / Self Care | Payer: Medicare Other | Source: Home / Self Care

## 2015-08-12 ENCOUNTER — Emergency Department (HOSPITAL_COMMUNITY): Payer: Medicare Other

## 2015-08-12 DIAGNOSIS — J441 Chronic obstructive pulmonary disease with (acute) exacerbation: Secondary | ICD-10-CM

## 2015-08-12 MED ORDER — IPRATROPIUM-ALBUTEROL 0.5-2.5 (3) MG/3ML IN SOLN
RESPIRATORY_TRACT | Status: AC
  Filled 2015-08-12: qty 3

## 2015-08-12 MED ORDER — ALBUTEROL 90 MCG/ACT IN AERS: 2.0000 | INHALATION_SPRAY | RESPIRATORY_TRACT | Status: AC | PRN

## 2015-08-12 MED ORDER — ALBUTEROL SULFATE (2.5 MG/3ML) 0.083% IN NEBU
2.5000 mg | INHALATION_SOLUTION | Freq: Once | RESPIRATORY_TRACT | Status: AC
  Administered 2015-08-12: 2.5 mg via RESPIRATORY_TRACT

## 2015-08-12 MED ORDER — DOXYCYCLINE HYCLATE 100 MG PO TABS: 100.0000 mg | ORAL_TABLET | Freq: Two times a day (BID) | ORAL | Status: AC

## 2015-08-12 NOTE — ED Notes (Signed)
Patient complains of cough and chest congestion for the past week Has been using OTC medications with little relief

## 2015-08-12 NOTE — ED Provider Notes (Signed)
CSN: [id number]     Arrival date & time 08/12/15  1301 History   None    No chief complaint on file.  65 y/o ? Reflux Osteoarthritis Hypothyroid Polyps Tobacco abuse Reflux + hiatal hernia Prior tonic-clonic seizure 02/2049 Seen and managed by psychiatry for anxiety-note that there is some question of benzodiazepine abuse   1/52 H/oh cough, Smoke 1-0.5 PPD No ill contacts No myalgia No diarrhea No body ache No rhinorrhea however sputum No swollen glands Denies chest pain Denies recent travel  No documented fever No sinus pressure  Tells me occasionally uses albuterol but has run out and this seems to make her feel better when she is to have it She also tells me that she is waiting on refill of her prescription of clonazepam and for this reason may be anxious because of that    HPI  Past Medical History  Diagnosis Date  . Asthma   . Depression   . GERD (gastroesophageal reflux disease)   . Inflammatory polyps of colon (HCC)     pt notes flat and is to have surgery to remove   . Osteoarthritis of both knees   . Hypothyroidism   . H/O hiatal hernia   . Seizure (HCC) 03/06/2014    tonic clonic seizure which lasted about a minute  . Anxiety   . Bipolar disorder (HCC)   . Benzodiazepine withdrawal with delirium (HCC) 03/06/2014   Past Surgical History  Procedure Laterality Date  . Cataract extraction w/ intraocular lens  implant, bilateral Bilateral   . Rotator cuff repair Right   . Vaginal hysterectomy  01/2003    lap assisted w/BSO/notes 12/23/2010  . Tubal ligation     Family History  Problem Relation Age of Onset  . Cancer Mother   . Cancer Brother   . Cancer Brother    Social History  Substance Use Topics  . Smoking status: Current Every Day Smoker -- 1.00 packs/day for 44 years    Types: Cigarettes  . Smokeless tobacco: Never Used  . Alcohol Use: No   OB History    No data available     Review of Systems  See above discussion   Allergies   Lamictal  Home Medications   Prior to Admission medications   Medication Sig Start Date End Date Taking? Authorizing Provider  cetirizine (ZYRTEC) 10 MG tablet Take 1 tablet (10 mg total) by mouth daily. 11/26/14   Charm Rings, MD  clonazePAM (KLONOPIN) 0.5 MG tablet Take 1 tablet (0.5 mg total) by mouth 2 (two) times daily. 06/19/15   Cleotis Nipper, MD  hydrocortisone 2.5 % lotion Apply topically 2 (two) times daily as needed. For itching 11/26/14   Charm Rings, MD  levothyroxine (SYNTHROID, LEVOTHROID) 88 MCG tablet 1 TABLET BY MOUTH DAILY ON EMPTY STOMACH 06/13/15   Historical Provider, MD  meloxicam (MOBIC) 15 MG tablet Take 15 mg by mouth daily. 08/28/14   Historical Provider, MD  PARoxetine (PAXIL) 40 MG tablet Take 1 tablet (40 mg total) by mouth daily. 05/20/15   Cleotis Nipper, MD  QUEtiapine (SEROQUEL) 300 MG tablet Take 2 tablets (600 mg total) by mouth at bedtime. 06/19/15   Cleotis Nipper, MD  ranitidine (ZANTAC) 150 MG capsule Take 1 capsule (150 mg total) by mouth 2 (two) times daily. 11/26/14   Charm Rings, MD   Meds Ordered and Administered this Visit  Medications - No data to display  There were no vitals taken  for this visit. No data found.   Physical Exam  EOMI NCAT flat affect No facial asymmetry  Chest slight wheeze posteriorly right posterior lung field, no fremitus no resonance No submandibular lymphadenopathy  Throat is clear poor dentition Mallampati 2 S1-S2 slightly tachycardic  Slight abdominal pain upper epigastrium no rebound or guarding No lower extremity edema Range of motion intact   ED Course  Procedures (including critical care time)  We will obtain 2 view CXR given tachycardia We will also give 1 time nebulization see if this helps  Labs Review Labs Reviewed - No data to display  Imaging Review No results found.   Visual Acuity Review  Right Eye Distance:   Left Eye Distance:   Bilateral Distance:    Right Eye Near:   Left Eye  Near:    Bilateral Near:         MDM  No diagnosis found.  Likely mild exacerbation of COPD in a current smoker We will provide albuterol inhaler with space and call [facility] [address]. I have counseled that she take prednisone as a burst however she refuses to do this as she says it makes her "ill". I have recommended that she follow up with outpatient physician about 1 week's time If she is to have fever chills or other signs or symptoms of a systemic infection she is to present back to the emergency room She understands the plan  > 25 minutes  Pleas KochJai Brylie Sneath, MD [facility] ([phone]) [phone]     Rhetta MuraJai-Gurmukh Latania Bascomb, MD 08/12/15 1350

## 2015-08-12 NOTE — Discharge Instructions (Signed)

## 2015-08-20 ENCOUNTER — Ambulatory Visit (HOSPITAL_COMMUNITY): Payer: Self-pay | Admitting: Psychology

## 2015-08-29 ENCOUNTER — Ambulatory Visit (INDEPENDENT_AMBULATORY_CARE_PROVIDER_SITE_OTHER): Payer: 59 | Admitting: Psychology

## 2015-08-29 DIAGNOSIS — F319 Bipolar disorder, unspecified: Secondary | ICD-10-CM

## 2015-08-29 NOTE — Progress Notes (Signed)
   THERAPIST PROGRESS NOTE  Session Time: 1.25pm-1.58pm  Participation Level: Active  Behavioral Response: Well GroomedAlert, AFFECT WNL  Type of Therapy: Individual Therapy  Treatment Goals addressed: Diagnosis: Bipolar 1 D/O and goal 1.  Interventions: Strength-based and Supportive  Summary: [name] is a 65 y.o. female who presents with affect WNL.  Pt reported that she day is going well and that her mood has been good overall lately.  Pt reports that she will have occasional days she feels depressed and doesn't want anyone around.  Pt reports that her sister brought her today and that interactions ok so far today.  Pt reported that she went there for Christmas and that sister wasn't the kindest bringing her home- calling her stupid.  Pt reports that it is one year since sister in laws death and aware hasn't allowed self to grieve.  Pt identified that she would like it if family was more engaged w/ contacting her.  Pt reports her cats as good support and company for her.    Suicidal/Homicidal: Nowithout intent/plan  Therapist Response: Assessed pt current functioning per pt report.  Explored w/pt her mood and her interactions w/ friends and family.  Encouraged pt to continue initiating contact and strength of her pets for companionship.  Discussed w/ pt potential barriers for her grief and dicussed how she has dealt w/ a lot of loss of very close family.   Plan: Return as scheduled to See Dr. Lolly Mustache 09/2015.  Pt reports she wants to return in 2 months for counseling and agrees to call for earlier appointment if any deterioration in symptoms.   Diagnosis:  Bipolar, Depressed      [name], LPC 08/29/2015

## 2015-08-30 ENCOUNTER — Other Ambulatory Visit (HOSPITAL_COMMUNITY): Payer: Self-pay | Admitting: Psychiatry

## 2015-09-02 ENCOUNTER — Other Ambulatory Visit (HOSPITAL_COMMUNITY): Payer: Self-pay | Admitting: Psychiatry

## 2015-09-02 DIAGNOSIS — F3132 Bipolar disorder, current episode depressed, moderate: Secondary | ICD-10-CM

## 2015-09-02 MED ORDER — PAROXETINE HCL 40 MG PO TABS: 40.0000 mg | ORAL_TABLET | Freq: Every day | ORAL | Status: AC

## 2015-09-05 ENCOUNTER — Other Ambulatory Visit (HOSPITAL_COMMUNITY): Payer: Self-pay | Admitting: Psychiatry

## 2015-09-05 ENCOUNTER — Telehealth (HOSPITAL_COMMUNITY): Payer: Self-pay

## 2015-09-05 NOTE — Telephone Encounter (Signed)
Patient is calling for a refill on her Seroquel. In reviewing the patient chart, I noted that she is not due for a refill. I called the patients pharmacy and was informed she picked up a 30 day supply on 08/17/2015. I called the patient and she stated she only had enough pills to get through until Monday. I asked her if she was taking as prescribed and she said she was. I went and s/w Dr. Lolly Mustache and he said no early refill, patient has a f/u appointment 09/19/2015 and she should have had enough to last until then. I called patient back and explained all of this, she became very upset and stated that she could not go without medication because she would go through withdrawls. She would like to s/w Dr. Lolly Mustache asap.

## 2015-09-06 NOTE — Telephone Encounter (Signed)
AI spoke with Dr. Lolly Mustache about this patient, he is not going to give her the early script as this is something of a pattern for her. I called and let the patient know. She was not happy and kept insisting that she was not taking more than prescribed. I advised the patient to discuss with doctor at her next visit.

## 2015-09-09 ENCOUNTER — Telehealth (HOSPITAL_COMMUNITY): Payer: Self-pay

## 2015-09-09 ENCOUNTER — Other Ambulatory Visit (HOSPITAL_COMMUNITY): Payer: Self-pay | Admitting: Psychiatry

## 2015-09-09 MED ORDER — HYDROXYZINE PAMOATE 25 MG PO CAPS: 25.0000 mg | ORAL_CAPSULE | Freq: Two times a day (BID) | ORAL | Status: DC | PRN

## 2015-09-09 NOTE — Telephone Encounter (Signed)
Telephone call with patient to follow up on refill [facility].  Called patient's pharmacy and Trinna Post, pharmacist reported patient had these filled on 06/22/15, 07/19/15, and 08/15/15.   Questioned why patient had these filled early and then called patient to discuss.  Informed patient this was a controlled medication which Dr. Lolly Mustache would not write early as patient should have had enough until returns on 09/19/15.  Patient at first reported she had not overtaken medication but then admitted at times she had taken more than prescribed.  Patient reported concern she would have a seizure without medication as she reports having in the past and requests to speak with Dr. Lolly Mustache about needed enough to last until she returns.  Informed patient this nurse would give provider the request but also reiterated to her he initially reported he would not fill early again and if she started having any problems she could go into the ED or and urgent care for evaluation.  Patient again requested this nurse have Dr. Lolly Mustache call to discuss and agreed with plan.  Patient stated she had proven to her pharmacy they had short changed her in Seroquel with most recent order, only giving her #30 so now reports they have given her enough to last until MD appointment on 09/19/15.

## 2015-09-09 NOTE — Telephone Encounter (Signed)
I returned patient's phone call.  She admitted ran out from Klonopin earlier because she was very nervous and anxious.  Patient has a history of benzodiazepine abuse.  We have the discussion in the past that he will not provide any benzodiazepine earlier than her scheduled date.  Patient apologizes and insists that she need something to help her anxiety.  I offer Vistaril 25 mg twice a day as needed and patient accepted it.  Patient denies any suicidal thoughts or homicidal thought.  Patient has appointment next week and we will talk about future treatment plan .

## 2015-09-10 ENCOUNTER — Telehealth (HOSPITAL_COMMUNITY): Payer: Self-pay

## 2015-09-10 NOTE — Telephone Encounter (Signed)
Medication problem - Patient showed up stating she was a walk-in and having problems with panic attacks.  Patient reported the Vistaril Dr. Adele Schilder had provided for her was not working and requested this nurse inform MD she was here with concerns.  Dennie Maizes, CMA reported patient had called twice this date with complaints and informed would call back once discussed with provider.  Patient next scheduled on 09/19/15.  Met with Dr. Adele Schilder to inform of patient's concerns and being at our facility.  Informed patient per Dr. Adele Schilder he was not going to write any more benzodiazepine medication or make any other changes at this time.  Informed patient her provider if panic attacks worsened or concerns she could go into an urgent care or a local ED to evaluation but to keep appointment on 09/19/15 to follow up. Informed Dr. Adele Schilder wanted patient to take Vistaril twice a day as ordered to assist with helping with anxiety but again reminded Dr. Adele Schilder was not willing to make any more changes at this time until her set evaluation date.  Patient stated understanding and reminded patient of need to take medication daily only as prescribed and that she must get providers approval for any changes.

## 2015-09-10 NOTE — Telephone Encounter (Signed)
Patient called in this morning, she has taken the vistaril prescribed by Dr. Lolly Mustache. Patient states that it is not as good as the Klonopin and she is still worried about seizures and withdrawal. I informed patient that she should be okay as long as she takes the vistaril  as prescribed, I told her if she felt like it was getting worse to go to her local ED or Urgent care, I also told her that she can call me back if needed.

## 2015-09-11 ENCOUNTER — Other Ambulatory Visit (HOSPITAL_COMMUNITY): Payer: Self-pay | Admitting: Psychiatry

## 2015-09-11 ENCOUNTER — Encounter (HOSPITAL_COMMUNITY): Payer: Self-pay

## 2015-09-11 ENCOUNTER — Emergency Department (HOSPITAL_COMMUNITY)
Admission: EM | Admit: 2015-09-11 | Discharge: 2015-09-11 | Disposition: A | Discharge status: Home / Self Care | Payer: Medicare Other | Attending: Emergency Medicine | Admitting: Emergency Medicine

## 2015-09-11 DIAGNOSIS — Z86010 Personal history of colonic polyps: Secondary | ICD-10-CM | POA: Diagnosis not present

## 2015-09-11 DIAGNOSIS — K219 Gastro-esophageal reflux disease without esophagitis: Secondary | ICD-10-CM | POA: Diagnosis not present

## 2015-09-11 DIAGNOSIS — J45909 Unspecified asthma, uncomplicated: Secondary | ICD-10-CM | POA: Insufficient documentation

## 2015-09-11 DIAGNOSIS — Z760 Encounter for issue of repeat prescription: Secondary | ICD-10-CM | POA: Insufficient documentation

## 2015-09-11 DIAGNOSIS — M170 Bilateral primary osteoarthritis of knee: Secondary | ICD-10-CM | POA: Diagnosis not present

## 2015-09-11 DIAGNOSIS — F319 Bipolar disorder, unspecified: Secondary | ICD-10-CM | POA: Diagnosis not present

## 2015-09-11 DIAGNOSIS — F17210 Nicotine dependence, cigarettes, uncomplicated: Secondary | ICD-10-CM | POA: Insufficient documentation

## 2015-09-11 DIAGNOSIS — E039 Hypothyroidism, unspecified: Secondary | ICD-10-CM | POA: Diagnosis not present

## 2015-09-11 DIAGNOSIS — F419 Anxiety disorder, unspecified: Secondary | ICD-10-CM

## 2015-09-11 DIAGNOSIS — Z791 Long term (current) use of non-steroidal anti-inflammatories (NSAID): Secondary | ICD-10-CM | POA: Diagnosis not present

## 2015-09-11 DIAGNOSIS — Z79899 Other long term (current) drug therapy: Secondary | ICD-10-CM | POA: Insufficient documentation

## 2015-09-11 MED ORDER — LORAZEPAM 1 MG PO TABS
1.0000 mg | ORAL_TABLET | Freq: Once | ORAL | Status: AC
  Administered 2015-09-11: 1 mg via ORAL
  Filled 2015-09-11: qty 1

## 2015-09-11 NOTE — Discharge Instructions (Signed)
It is important for you to follow-up with your doctor concerning your benzodiazepine prescription. Return to ED for new or worsening symptoms

## 2015-09-11 NOTE — ED Provider Notes (Signed)
CSN: [id number]     Arrival date & time 09/11/15  1037 History  By signing my name below, I, Tanda Rockers, attest that this documentation has been prepared under the direction and in the presence of [employer], PA-C. Electronically Signed: Tanda Rockers, ED Scribe. 09/11/2015. 12:10 PM.    Chief Complaint  Patient presents with  . Anxiety  . Medication Refill   The history is provided by the patient. No language interpreter was used.     HPI Comments: [name] is a 65 y.o. female with hx depression, anxiety, and bipolar disorder who presents to the Emergency Department complaining of worsening anxiety that began this morning. Pt reports that she has been out of her Klonopin for 3 days. She was seen by her PCP yesterday but PCP would not rewrite her Klonopin prescription because it was not due for a refill yet. Her PCP prescribed Vistaril for the time being but pt states it has not been helping, causing her to have worsening anxiety. She has an appointment on Monday, 09/16/2015 (approximately 5 days from now) to get her Klonopin prescription. She denies auditory or visual hallucinations, SI, or HI. Pt also denies drinking EtOH.   Past Medical History  Diagnosis Date  . Asthma   . Depression   . GERD (gastroesophageal reflux disease)   . Inflammatory polyps of colon (HCC)     pt notes flat and is to have surgery to remove   . Osteoarthritis of both knees   . Hypothyroidism   . H/O hiatal hernia   . Seizure (HCC) 03/06/2014    tonic clonic seizure which lasted about a minute  . Anxiety   . Bipolar disorder (HCC)   . Benzodiazepine withdrawal with delirium (HCC) 03/06/2014   Past Surgical History  Procedure Laterality Date  . Cataract extraction w/ intraocular lens  implant, bilateral Bilateral   . Rotator cuff repair Right   . Vaginal hysterectomy  01/2003    lap assisted w/BSO/notes 12/23/2010  . Tubal ligation     Family History  Problem Relation Age of Onset  .  Cancer Mother   . Cancer Brother   . Cancer Brother    Social History  Substance Use Topics  . Smoking status: Current Every Day Smoker -- 1.00 packs/day for 44 years    Types: Cigarettes  . Smokeless tobacco: Never Used  . Alcohol Use: No   OB History    No data available     Review of Systems  A complete 10 system review of systems was obtained and all systems are negative except as noted in the HPI and PMH.   Allergies  Lamictal and Cortizone-10  Home Medications   Prior to Admission medications   Medication Sig Start Date End Date Taking? Authorizing Provider  albuterol (PROVENTIL,VENTOLIN) 90 MCG/ACT inhaler Inhale 2 puffs into the lungs every 4 (four) hours as needed. 08/12/15 08/06/16  Rhetta Mura, MD  cetirizine (ZYRTEC) 10 MG tablet Take 1 tablet (10 mg total) by mouth daily. 11/26/14   Charm Rings, MD  clonazePAM (KLONOPIN) 0.5 MG tablet Take 1 tablet (0.5 mg total) by mouth 2 (two) times daily. 06/19/15   Cleotis Nipper, MD  hydrocortisone 2.5 % lotion Apply topically 2 (two) times daily as needed. For itching 11/26/14   Charm Rings, MD  hydrOXYzine (VISTARIL) 25 MG capsule Take 1 capsule (25 mg total) by mouth 2 (two) times daily as needed for anxiety. 09/09/15   Cleotis Nipper,  MD  levothyroxine (SYNTHROID, LEVOTHROID) 88 MCG tablet 1 TABLET BY MOUTH DAILY ON EMPTY STOMACH 06/13/15   Historical Provider, MD  meloxicam (MOBIC) 15 MG tablet Take 15 mg by mouth daily. 08/28/14   Historical Provider, MD  PARoxetine (PAXIL) 40 MG tablet Take 1 tablet (40 mg total) by mouth daily. 09/02/15   Cleotis Nipper, MD  QUEtiapine (SEROQUEL) 300 MG tablet Take 2 tablets (600 mg total) by mouth at bedtime. 06/19/15   Cleotis Nipper, MD  ranitidine (ZANTAC) 150 MG capsule Take 1 capsule (150 mg total) by mouth 2 (two) times daily. 11/26/14   Charm Rings, MD   BP 144/97 mmHg  Pulse 113  Temp(Src) 97.9 F (36.6 C) (Oral)  Resp 16  SpO2 94%   Physical Exam  Constitutional: She is  oriented to person, place, and time. She appears well-developed and well-nourished. No distress.  HENT:  Head: Normocephalic and atraumatic.  Eyes: Conjunctivae and EOM are normal.  Neck: Neck supple. No tracheal deviation present.  Cardiovascular: Normal rate.   Pulmonary/Chest: Effort normal. No respiratory distress.  Musculoskeletal: Normal range of motion.  Neurological: She is alert and oriented to person, place, and time. No cranial nerve deficit.  No tremor. No apparent agitation  Skin: Skin is warm and dry. She is not diaphoretic.  Psychiatric: She has a normal mood and affect. Her behavior is normal.  Nursing note and vitals reviewed.   ED Course  Procedures (including critical care time)  DIAGNOSTIC STUDIES: Oxygen Saturation is 94% on RA, normal by my interpretation.    COORDINATION OF CARE: 12:10 PM-Discussed treatment plan which includes Ativan tablet in ED with pt at bedside and pt agreed to plan.   Labs Review Labs Reviewed - No data to display  Imaging Review No results found.   EKG Interpretation None     Meds given in ED:  Medications  LORazepam (ATIVAN) tablet 1 mg (1 mg Oral Given 09/11/15 1225)    Discharge Medication List as of 09/11/2015 12:38 PM     Filed Vitals:   09/11/15 1104  BP: 144/97  Pulse: 113  Temp: 97.9 F (36.6 C)  TempSrc: Oral  Resp: 16  SpO2: 94%    MDM  [name] is a 65 y.o. female history of benzodiazepine abuse, comes in for evaluation of anxiety. Patient reports she ran out of her clonazepam prescription due to taking medication to often. She reports calling her PCP and being referred to the ED for evaluation as he would not refill benzodiazepine medication. She is mildly tachycardic on arrival and reports generalized anxiety but no auditory or visual hallucinations, no suicidal or homicidal ideations. Denies any alcohol use. Treatment in the ED with 1 mg oral Ativan.  She does have mild tachycardia in the ED  arrival, heart rate mid 90s on my exam. No other evidence of withdrawal. Discussed will not be refilling visit has been medication and she needed to follow-up with her PCP for medication management. She verbalizes understanding and agrees with this plan. .  Final diagnoses:  Anxiety    I personally performed the services described in this documentation, which was scribed in my presence. The recorded information has been reviewed and is accurate.      Joycie Peek, PA-C 09/11/15 [address], PA-C 09/11/15 1405  Mancel Bale, MD 09/11/15 [phone]

## 2015-09-11 NOTE — ED Notes (Signed)
Pt c/o anxiety.  Sts she ran out of medication x 3 days ago.  Pt reports her MD would not refill her klonopin prescription, due to "it being too soon."  Further, MD called in a prescription for Vistaril, but pt does not think that it is working.

## 2015-09-19 ENCOUNTER — Ambulatory Visit (HOSPITAL_COMMUNITY): Payer: Self-pay | Admitting: Psychiatry

## 2015-09-23 ENCOUNTER — Other Ambulatory Visit (HOSPITAL_COMMUNITY): Payer: Self-pay | Admitting: Psychiatry

## 2015-09-28 ENCOUNTER — Other Ambulatory Visit (HOSPITAL_COMMUNITY): Payer: Self-pay | Admitting: Psychiatry

## 2015-10-07 ENCOUNTER — Other Ambulatory Visit (HOSPITAL_COMMUNITY): Payer: Self-pay | Admitting: Psychiatry

## 2015-10-28 ENCOUNTER — Ambulatory Visit (HOSPITAL_COMMUNITY): Payer: Self-pay | Admitting: Psychology

## 2015-10-28 ENCOUNTER — Encounter (HOSPITAL_COMMUNITY): Payer: Self-pay | Admitting: Psychology

## 2015-10-28 NOTE — Progress Notes (Signed)
Brittany SloughCarolyn S Stief is a 65 y.o. female patient who didn't show for her appointment. Letter sent.        Forde RadonYATES,Clorene Nerio, LPC

## 2016-02-14 ENCOUNTER — Emergency Department (HOSPITAL_COMMUNITY)
Admission: EM | Admit: 2016-02-14 | Discharge: 2016-02-14 | Disposition: A | Discharge status: Home / Self Care | Payer: Medicare Other | Attending: Emergency Medicine | Admitting: Emergency Medicine

## 2016-02-14 ENCOUNTER — Encounter (HOSPITAL_COMMUNITY): Payer: Self-pay | Admitting: Emergency Medicine

## 2016-02-14 DIAGNOSIS — F329 Major depressive disorder, single episode, unspecified: Secondary | ICD-10-CM | POA: Diagnosis not present

## 2016-02-14 DIAGNOSIS — F410 Panic disorder [episodic paroxysmal anxiety] without agoraphobia: Secondary | ICD-10-CM | POA: Diagnosis present

## 2016-02-14 DIAGNOSIS — Z79899 Other long term (current) drug therapy: Secondary | ICD-10-CM | POA: Insufficient documentation

## 2016-02-14 DIAGNOSIS — J45909 Unspecified asthma, uncomplicated: Secondary | ICD-10-CM | POA: Insufficient documentation

## 2016-02-14 DIAGNOSIS — F17210 Nicotine dependence, cigarettes, uncomplicated: Secondary | ICD-10-CM | POA: Insufficient documentation

## 2016-02-14 DIAGNOSIS — Z7951 Long term (current) use of inhaled steroids: Secondary | ICD-10-CM | POA: Diagnosis not present

## 2016-02-14 DIAGNOSIS — E039 Hypothyroidism, unspecified: Secondary | ICD-10-CM | POA: Diagnosis not present

## 2016-02-14 MED ORDER — LORAZEPAM 1 MG PO TABS
2.0000 mg | ORAL_TABLET | Freq: Once | ORAL | Status: AC
  Administered 2016-02-14: 2 mg via ORAL
  Filled 2016-02-14: qty 2

## 2016-02-14 NOTE — ED Provider Notes (Signed)
CSN: [id number]     Arrival date & time 02/14/16  1018 History   First MD Initiated Contact with Patient 02/14/16 1029     Chief Complaint  Patient presents with  . Panic Attack    Patient is a 65 y.o. female presenting with anxiety. The history is provided by the patient.  Anxiety This is a chronic problem. The problem occurs daily. The problem has been gradually worsening. Nothing aggravates the symptoms. Nothing relieves the symptoms.   Patient with long h/o anxiety and frequent panic attacks She has run out of klonopin over 24 hrs ago and she is feeling anxious and "I am going to jump out of my skin" No SI is voiced No fever/vomiting She has chronic pain in knees but no other new pain complaints No recent seizure but she reports distant h/o BDZ withdrawal induced seizure but she does not recall many details  Past Medical History  Diagnosis Date  . Asthma   . Depression   . GERD (gastroesophageal reflux disease)   . Inflammatory polyps of colon (HCC)     pt notes flat and is to have surgery to remove   . Osteoarthritis of both knees   . Hypothyroidism   . H/O hiatal hernia   . Seizure (HCC) 03/06/2014    tonic clonic seizure which lasted about a minute  . Anxiety   . Bipolar disorder (HCC)   . Benzodiazepine withdrawal with delirium (HCC) 03/06/2014   Past Surgical History  Procedure Laterality Date  . Cataract extraction w/ intraocular lens  implant, bilateral Bilateral   . Rotator cuff repair Right   . Vaginal hysterectomy  01/2003    lap assisted w/BSO/notes 12/23/2010  . Tubal ligation     Family History  Problem Relation Age of Onset  . Cancer Mother   . Cancer Brother   . Cancer Brother    Social History  Substance Use Topics  . Smoking status: Current Every Day Smoker -- 1.00 packs/day for 44 years    Types: Cigarettes  . Smokeless tobacco: Never Used  . Alcohol Use: No   OB History    No data available     Review of Systems  Constitutional: Negative  for fever.  Musculoskeletal: Positive for arthralgias.  Psychiatric/Behavioral: The patient is nervous/anxious.       Allergies  Lamictal and Cortizone-10  Home Medications   Prior to Admission medications   Medication Sig Start Date End Date Taking? Authorizing Provider  albuterol (PROVENTIL,VENTOLIN) 90 MCG/ACT inhaler Inhale 2 puffs into the lungs every 4 (four) hours as needed. 08/12/15 08/06/16  Rhetta MuraJai-Gurmukh Samtani, MD  cetirizine (ZYRTEC) 10 MG tablet Take 1 tablet (10 mg total) by mouth daily. 11/26/14   Charm RingsErin J Honig, MD  clonazePAM (KLONOPIN) 0.5 MG tablet Take 1 tablet (0.5 mg total) by mouth 2 (two) times daily. 06/19/15   Cleotis NipperSyed T Arfeen, MD  hydrocortisone 2.5 % lotion Apply topically 2 (two) times daily as needed. For itching 11/26/14   Charm RingsErin J Honig, MD  hydrOXYzine (VISTARIL) 25 MG capsule Take 1 capsule (25 mg total) by mouth 2 (two) times daily as needed for anxiety. 09/09/15   Cleotis NipperSyed T Arfeen, MD  levothyroxine (SYNTHROID, LEVOTHROID) 88 MCG tablet 1 TABLET BY MOUTH DAILY ON EMPTY STOMACH 06/13/15   Historical Provider, MD  meloxicam (MOBIC) 15 MG tablet Take 15 mg by mouth daily. 08/28/14   Historical Provider, MD  PARoxetine (PAXIL) 40 MG tablet Take 1 tablet (40 mg total) by mouth  daily. 09/02/15   Cleotis NipperSyed T Arfeen, MD  QUEtiapine (SEROQUEL) 300 MG tablet Take 2 tablets (600 mg total) by mouth at bedtime. 06/19/15   Cleotis NipperSyed T Arfeen, MD  ranitidine (ZANTAC) 150 MG capsule Take 1 capsule (150 mg total) by mouth 2 (two) times daily. 11/26/14   Charm RingsErin J Honig, MD   BP 118/87 mmHg  Pulse 90  Temp(Src) 97.8 F (36.6 C) (Oral)  Resp 18  SpO2 98% Physical Exam CONSTITUTIONAL: Well developed/well nourished. Mildly anxious HEAD: Normocephalic/atraumatic EYES: EOMI ENMT: Mucous membranes moist NECK: supple no meningeal signs CV: S1/S2 noted, no murmurs/rubs/gallops noted LUNGS: Lungs are clear to auscultation bilaterally, no apparent distress ABDOMEN: soft NEURO: Pt is  awake/alert/appropriate, moves all extremitiesx4.  No tremor EXTREMITIES: pulses normal/equal, full ROM SKIN: warm, color normal PSYCH: mildly anxious  ED Course  Procedures  Medications  LORazepam (ATIVAN) tablet 2 mg (2 mg Oral Given 02/14/16 1128)    Pt admits to running out of klonopin early and was told she can't get anymore from PCP until 7/14 She feels anxious but no other new complaints Ativan given here advised need to call her PCP for further guidance on her prescriptions I feel she is low risk for SZ at this time as vitals appropriate, no tachycardia, given time course of last dose.   MDM   Final diagnoses:  Panic attack    Nursing notes including past medical history and social history reviewed and considered in documentation Previous records reviewed and considered Narcotic database reviewed and considered in decision making     Zadie Rhineonald Jere Bostrom, MD 02/14/16 1209

## 2016-02-14 NOTE — ED Notes (Signed)
Pt request medication refill for panic attacks; ran out yesterday. Pt states "feels like I am going to jump out of my skin." Pt denies SI/HI.

## 2016-02-14 NOTE — Discharge Instructions (Signed)

## 2016-03-09 ENCOUNTER — Encounter (HOSPITAL_COMMUNITY): Payer: Self-pay | Admitting: Psychology

## 2016-03-09 DIAGNOSIS — F319 Bipolar disorder, unspecified: Secondary | ICD-10-CM

## 2016-03-09 NOTE — Progress Notes (Signed)
[name] is a 65 y.o. female patient being discharged from counseling as last seen on 08/29/15.  Outpatient Therapist Discharge Summary  ROSEA SCHEURER    28-Dec-1950   Admission Date: 07/26/14   Discharge Date:  03/09/16  Reason for Discharge:  Not active w/ counseling Diagnosis:   Bipolar 1 disorder (HCC)    Comments:  Pt not active w/ psychiatrist either  Alfredo Batty, LPC

## 2016-04-15 ENCOUNTER — Emergency Department (HOSPITAL_COMMUNITY)
Admission: EM | Admit: 2016-04-15 | Discharge: 2016-04-15 | Disposition: A | Discharge status: Home / Self Care | Payer: Medicare Other | Attending: Emergency Medicine | Admitting: Emergency Medicine

## 2016-04-15 ENCOUNTER — Encounter (HOSPITAL_COMMUNITY): Payer: Self-pay | Admitting: *Deleted

## 2016-04-15 DIAGNOSIS — E039 Hypothyroidism, unspecified: Secondary | ICD-10-CM | POA: Diagnosis not present

## 2016-04-15 DIAGNOSIS — Z760 Encounter for issue of repeat prescription: Secondary | ICD-10-CM | POA: Insufficient documentation

## 2016-04-15 DIAGNOSIS — Z7951 Long term (current) use of inhaled steroids: Secondary | ICD-10-CM | POA: Insufficient documentation

## 2016-04-15 DIAGNOSIS — F419 Anxiety disorder, unspecified: Secondary | ICD-10-CM | POA: Diagnosis present

## 2016-04-15 DIAGNOSIS — J45909 Unspecified asthma, uncomplicated: Secondary | ICD-10-CM | POA: Diagnosis not present

## 2016-04-15 DIAGNOSIS — F17210 Nicotine dependence, cigarettes, uncomplicated: Secondary | ICD-10-CM | POA: Diagnosis not present

## 2016-04-15 DIAGNOSIS — Z79899 Other long term (current) drug therapy: Secondary | ICD-10-CM | POA: Diagnosis not present

## 2016-04-15 MED ORDER — HYDROXYZINE HCL 50 MG PO TABS: 50.0000 mg | ORAL_TABLET | Freq: Four times a day (QID) | ORAL | 0 refills | Status: DC | PRN

## 2016-04-15 MED ORDER — CLONAZEPAM 0.5 MG PO TABS
0.5000 mg | ORAL_TABLET | Freq: Once | ORAL | Status: AC
  Administered 2016-04-15: 0.5 mg via ORAL
  Filled 2016-04-15: qty 1

## 2016-04-15 NOTE — ED Provider Notes (Signed)
[facility]-EMERGENCY DEPT Provider Note   CSN: [id number] Arrival date & time: 04/15/16  1134     History   Chief Complaint Chief Complaint  Patient presents with  . Anxiety    HPI [name] is a 65 y.o. female with a PMHx of anxiety, asthma, bipolar disorder, benzo w/d with delirium in 2015, GERD, hiatal hernia, hypothyroidism, and OA of both knees, who presents to the ED with complaints of "panic attack". Patient states that 2 days ago she ran out of her Klonopin 0.5 mg that she takes 3 times daily, went to her pharmacy today with her next prescription and was told that her insurance wouldn't cover it because she has to days early. She states that she typically sees her PCP every 3 months and gets postdated prescriptions, so the next prescription refill is due on 04/17/16. She states today that she feels like she is "going to jump out of her skin" and has been crying due to her anxiety. She states this is typical for her panic attacks. She has not tried anything for symptoms, and has no known aggravating factors. She denies any fevers, chills, chest pain, shortness breath, abdominal pain, nausea, vomiting, diarrhea, constipation, dysuria, hematuria, numbness, tingling, focal weakness, SI, HI, or AVH. Denies seizure-like activity.    The history is provided by the patient and medical records. No language interpreter was used.    Past Medical History:  Diagnosis Date  . Anxiety   . Asthma   . 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 Bipolar disorder (HCC)   . Depression   . GERD (gastroesophageal reflux disease)   . H/O hiatal hernia   . Hypothyroidism   . Inflammatory polyps of colon (HCC)    pt notes flat and is to have surgery to remove   . Osteoarthritis of both knees   . Seizure (HCC) 03/06/2014   tonic clonic seizure which lasted about a minute    Patient Active Problem List   Diagnosis Date Noted  . Benzodiazepine withdrawal with delirium (HCC)  03/06/2014  . Seizure (HCC) 03/06/2014  . Urinary tract infection, site not specified 03/06/2014  . Osteoarthritis of both knees 01/13/2012  . Bipolar 1 disorder (HCC) 01/13/2012  . Hypothyroid 01/13/2012  . Tobacco use 01/13/2012    Past Surgical History:  Procedure Laterality Date  . CATARACT EXTRACTION W/ INTRAOCULAR LENS  IMPLANT, BILATERAL Bilateral   . ROTATOR CUFF REPAIR Right   . TUBAL LIGATION    . VAGINAL HYSTERECTOMY  01/2003   lap assisted w/BSO/notes 12/23/2010    OB History    No data available       Home Medications    Prior to Admission medications   Medication Sig Start Date End Date Taking? Authorizing Provider  albuterol (PROVENTIL,VENTOLIN) 90 MCG/ACT inhaler Inhale 2 puffs into the lungs every 4 (four) hours as needed. 08/12/15 08/06/16  Rhetta Mura, MD  cetirizine (ZYRTEC) 10 MG tablet Take 1 tablet (10 mg total) by mouth daily. 11/26/14   Charm Rings, MD  clonazePAM (KLONOPIN) 0.5 MG tablet Take 1 tablet (0.5 mg total) by mouth 2 (two) times daily. 06/19/15   Cleotis Nipper, MD  hydrocortisone 2.5 % lotion Apply topically 2 (two) times daily as needed. For itching 11/26/14   Charm Rings, MD  hydrOXYzine (VISTARIL) 25 MG capsule Take 1 capsule (25 mg total) by mouth 2 (two) times daily as needed for anxiety. 09/09/15   Cleotis Nipper, MD  levothyroxine (SYNTHROID,  LEVOTHROID) 88 MCG tablet 1 TABLET BY MOUTH DAILY ON EMPTY STOMACH 06/13/15   Historical Provider, MD  meloxicam (MOBIC) 15 MG tablet Take 15 mg by mouth daily. 08/28/14   Historical Provider, MD  PARoxetine (PAXIL) 40 MG tablet Take 1 tablet (40 mg total) by mouth daily. 09/02/15   Cleotis Nipper, MD  QUEtiapine (SEROQUEL) 300 MG tablet Take 2 tablets (600 mg total) by mouth at bedtime. 06/19/15   Cleotis Nipper, MD  ranitidine (ZANTAC) 150 MG capsule Take 1 capsule (150 mg total) by mouth 2 (two) times daily. 11/26/14   Charm Rings, MD    Family History Family History  Problem Relation Age of  Onset  . Cancer Mother   . Cancer Brother   . Cancer Brother     Social History Social History  Substance Use Topics  . Smoking status: Current Every Day Smoker    Packs/day: 1.00    Years: 44.00    Types: Cigarettes  . Smokeless tobacco: Never Used  . Alcohol use No     Allergies   Lamictal [lamotrigine] and Cortizone-10 [hydrocortisone]   Review of Systems Review of Systems  Constitutional: Negative for chills and fever.  Respiratory: Negative for shortness of breath.   Cardiovascular: Negative for chest pain.  Gastrointestinal: Negative for abdominal pain, constipation, diarrhea, nausea and vomiting.  Genitourinary: Negative for dysuria and hematuria.  Musculoskeletal: Negative for arthralgias and myalgias.  Skin: Negative for color change.  Allergic/Immunologic: Negative for immunocompromised state.  Neurological: Negative for seizures, weakness and numbness.  Psychiatric/Behavioral: Negative for confusion, hallucinations and suicidal ideas. The patient is nervous/anxious.    10 Systems reviewed and are negative for acute change except as noted in the HPI.   Physical Exam Updated Vital Signs BP 131/97 (BP Location: Right Arm)   Pulse 107   Temp 98.7 F (37.1 C) (Oral)   Resp 18   SpO2 95%   Physical Exam  Constitutional: She is oriented to person, place, and time. Vital signs are normal. She appears well-developed and well-nourished.  Non-toxic appearance. No distress.  Afebrile, nontoxic, NAD  HENT:  Head: Normocephalic and atraumatic.  Mouth/Throat: Oropharynx is clear and moist and mucous membranes are normal.  Eyes: Conjunctivae and EOM are normal. Right eye exhibits no discharge. Left eye exhibits no discharge.  Neck: Normal range of motion. Neck supple.  Cardiovascular: Regular rhythm, normal heart sounds and intact distal pulses.  Tachycardia present.  Exam reveals no gallop and no friction rub.   No murmur heard. Mildly tachycardic in the low 100s  which resolved during exam  Pulmonary/Chest: Effort normal and breath sounds normal. No respiratory distress. She has no decreased breath sounds. She has no wheezes. She has no rhonchi. She has no rales.  Abdominal: Soft. Normal appearance and bowel sounds are normal. She exhibits no distension. There is no tenderness. There is no rigidity, no rebound, no guarding, no CVA tenderness, no tenderness at McBurney's point and negative Murphy's sign.  Musculoskeletal: Normal range of motion.  Neurological: She is alert and oriented to person, place, and time. She has normal strength. She displays no tremor. No sensory deficit.  No tremors or seizure-like activity  Skin: Skin is warm, dry and intact. No rash noted.  Psychiatric: Her mood appears anxious. She is not actively hallucinating. She expresses no homicidal and no suicidal ideation. She expresses no suicidal plans and no homicidal plans.  Calm and cooperative, slightly anxious appearing, denies SI/HI/AVH  Nursing note and vitals  reviewed.    ED Treatments / Results  Labs (all labs ordered are listed, but only abnormal results are displayed) Labs Reviewed - No data to display  EKG  EKG Interpretation None       Radiology No results found.  Procedures Procedures (including critical care time)  Medications Ordered in ED Medications  clonazePAM (KLONOPIN) tablet 0.5 mg (not administered)     Initial Impression / Assessment and Plan / ED Course  I have reviewed the triage vital signs and the nursing notes.  Pertinent labs & imaging results that were available during my care of the patient were reviewed by me and considered in my medical decision making (see chart for details).  Clinical Course    65 y.o. female here with anxiety, states she ran out of her klonopin early and can't get the next rx filled until 2 days from now. NCDB reviewed, she has had Klonopin 0.5mg  #90 refilled on 03/17/16 and 02/18/16 (both written on 02/12/16)  and #140 tabs on 01/05/16 and #90 tabs on 12/09/15. All written by the same physician (Dr. Salli RealSun Yun). This is the second ER visit in the last 6 months for similar complaints. Denies anything aside from anxiety, no CP/SOB. No seizure like activity reported or seen on exam. Mildly tachycardic which improved during exam. Discussed that we'll give her one time dose of her home klonopin, and give her vistaril rx to try to use for anxiety until she can refill her regular klonopin. Discussed that she needs to see her PCP for ongoing management of anxiety and further med management with regards to running out early of her controlled substances. I explained the diagnosis and have given explicit precautions to return to the ER including for any other new or worsening symptoms. The patient understands and accepts the medical plan as it's been dictated and I have answered their questions. Discharge instructions concerning home care and prescriptions have been given. The patient is STABLE and is discharged to home in good condition.   Final Clinical Impressions(s) / ED Diagnoses   Final diagnoses:  Anxiety  Encounter for medication refill    New Prescriptions New Prescriptions   HYDROXYZINE (ATARAX/VISTARIL) 50 MG TABLET    Take 1 tablet (50 mg total) by mouth every 6 (six) hours as needed for anxiety or itching.     [address]Mercedes Hermosa Beachamprubi-Soms, PA-C 04/15/16 1247    Gerhard Munchobert Lockwood, MD 04/16/16 1133

## 2016-04-15 NOTE — Discharge Instructions (Signed)
You were seen today for your anxiety, and given your home dose of Klonopin once here because you ran out early. You will need to see your regular doctor regarding running out early of your Klonopin, and ways to prevent this from happening. Fill your regular prescription when you're able to in 2 days. In the mean time you can try using vistaril as directed to help with anxiety. Follow up with your doctor in the next 3-5 days for ongoing management of your anxiety and for further refills of your medications. Return to the ER for changes or worsening symptoms.

## 2016-04-15 NOTE — ED Triage Notes (Signed)
Per EMS, pt complains of anxiety. Pt ran out of prescribed anxiety medication and cannot get a refill until Friday. Pt denies SI/HI.

## 2016-10-27 ENCOUNTER — Other Ambulatory Visit: Payer: Self-pay | Admitting: Orthopedic Surgery

## 2016-11-03 ENCOUNTER — Encounter (HOSPITAL_COMMUNITY): Payer: Self-pay

## 2016-11-03 ENCOUNTER — Encounter (HOSPITAL_COMMUNITY)
Admission: RE | Admit: 2016-11-03 | Discharge: 2016-11-03 | Disposition: A | Discharge status: Home / Self Care | Payer: Medicare Other | Source: Ambulatory Visit | Attending: Orthopedic Surgery | Admitting: Orthopedic Surgery

## 2016-11-03 DIAGNOSIS — Z01812 Encounter for preprocedural laboratory examination: Secondary | ICD-10-CM | POA: Insufficient documentation

## 2016-11-03 HISTORY — DX: Pneumonia, unspecified organism: J18.9

## 2016-11-03 HISTORY — DX: Headache: R51

## 2016-11-03 HISTORY — DX: Presence of dental prosthetic device (complete) (partial): Z97.2

## 2016-11-03 HISTORY — DX: Complete loss of teeth, unspecified cause, unspecified class: K08.109

## 2016-11-03 HISTORY — DX: Family history of other specified conditions: Z84.89

## 2016-11-03 HISTORY — DX: Ocular albinism, unspecified: E70.319

## 2016-11-03 HISTORY — DX: Occlusion and stenosis of bilateral carotid arteries: I65.23

## 2016-11-03 HISTORY — DX: Headache, unspecified: R51.9

## 2016-11-03 HISTORY — DX: Anemia, unspecified: D64.9

## 2016-11-03 LAB — CBC WITH DIFFERENTIAL/PLATELET
Basophils Absolute: 0.1 10*3/uL (ref 0.0–0.1)
Basophils Relative: 1 %
EOS ABS: 0.2 10*3/uL (ref 0.0–0.7)
Eosinophils Relative: 4 %
HCT: 40.1 % (ref 36.0–46.0)
Hemoglobin: 12.8 g/dL (ref 12.0–15.0)
LYMPHS ABS: 1.6 10*3/uL (ref 0.7–4.0)
LYMPHS PCT: 27 %
MCH: 30 pg (ref 26.0–34.0)
MCHC: 31.9 g/dL (ref 30.0–36.0)
MCV: 93.9 fL (ref 78.0–100.0)
MONO ABS: 0.5 10*3/uL (ref 0.1–1.0)
MONOS PCT: 8 %
Neutro Abs: 3.6 10*3/uL (ref 1.7–7.7)
Neutrophils Relative %: 60 %
PLATELETS: 372 10*3/uL (ref 150–400)
RBC: 4.27 MIL/uL (ref 3.87–5.11)
RDW: 14.8 % (ref 11.5–15.5)
WBC: 6 10*3/uL (ref 4.0–10.5)

## 2016-11-03 LAB — PROTIME-INR
INR: 1
PROTHROMBIN TIME: 13.2 s (ref 11.4–15.2)

## 2016-11-03 LAB — COMPREHENSIVE METABOLIC PANEL
ALT: 11 U/L — AB (ref 14–54)
ANION GAP: 10 (ref 5–15)
AST: 18 U/L (ref 15–41)
Albumin: 4.1 g/dL (ref 3.5–5.0)
Alkaline Phosphatase: 139 U/L — ABNORMAL HIGH (ref 38–126)
BUN: 8 mg/dL (ref 6–20)
CHLORIDE: 103 mmol/L (ref 101–111)
CO2: 25 mmol/L (ref 22–32)
CREATININE: 1.23 mg/dL — AB (ref 0.44–1.00)
Calcium: 9.4 mg/dL (ref 8.9–10.3)
GFR, EST AFRICAN AMERICAN: 52 mL/min — AB (ref >60)
GFR, EST NON AFRICAN AMERICAN: 45 mL/min — AB (ref >60)
Glucose, Bld: 105 mg/dL — ABNORMAL HIGH (ref 65–99)
Potassium: 4.2 mmol/L (ref 3.5–5.1)
Sodium: 138 mmol/L (ref 135–145)
Total Bilirubin: 0.7 mg/dL (ref 0.3–1.2)
Total Protein: 7.2 g/dL (ref 6.5–8.1)

## 2016-11-03 LAB — APTT: aPTT: 32 seconds (ref 24–36)

## 2016-11-03 LAB — ABO/RH: ABO/RH(D): O POS

## 2016-11-03 LAB — SURGICAL PCR SCREEN
MRSA, PCR: NEGATIVE
Staphylococcus aureus: POSITIVE — AB

## 2016-11-03 LAB — URINALYSIS, ROUTINE W REFLEX MICROSCOPIC
Bilirubin Urine: NEGATIVE
GLUCOSE, UA: NEGATIVE mg/dL
KETONES UR: NEGATIVE mg/dL
Nitrite: POSITIVE — AB
PH: 5.5 (ref 5.0–8.0)
Protein, ur: NEGATIVE mg/dL
Specific Gravity, Urine: 1.015 (ref 1.005–1.030)

## 2016-11-03 LAB — URINALYSIS, MICROSCOPIC (REFLEX): RBC / HPF: NONE SEEN RBC/hpf (ref 0–5)

## 2016-11-03 LAB — TYPE AND SCREEN
ABO/RH(D): O POS
Antibody Screen: NEGATIVE

## 2016-11-03 NOTE — Pre-Procedure Instructions (Signed)
[name]  11/03/2016      [facility] - [place] - [address] [address] [address] [address] [address] [place] [id number] Phone: [phone] Fax: [phone]  [facility] [id number] - [place], [place] - [address] [place] [address] Phone: [phone] Fax: [phone]    Your procedure is scheduled on Monday, November 09, 2016  [facility] Admitting at 10:30 A.M.  Call this number if you have problems the morning of surgery:  [phone]   Remember:  Do not eat food or drink liquids after midnight Sunday, November 08, 2016  Take these medicines the morning of surgery with A SIP OF WATER: clonazePAM (KLONOPIN),  levothyroxine (SYNTHROID), omeprazole (PRILOSEC), PARoxetine (PAXIL), if needed:traMADol (ULTRAM) for pain, cetirizine (ZYRTEC) for allergies, albuterol (PROVENTIL,VENTOLIN) 90 MCG/ACT inhaler ( Bring inhaler in with you on day of procedure) Stop taking Aspirin, vitamins, fish oil and herbal medications. Do not take any NSAIDs ie: Ibuprofen, Advil, Naproxen, BC and Goody Powder or any medication containing Aspirin; stop now.  Do not wear jewelry, make-up or nail polish.  Do not wear lotions, powders, or perfumes, or deoderant.  Do not shave 48 hours prior to surgery.    Do not bring valuables to the hospital.  [facility] is not responsible for any belongings or valuables.  Contacts, dentures or bridgework may not be worn into surgery.  Leave your suitcase in the car.  After surgery it may be brought to your room. For patients admitted to the hospital, discharge time will be determined by your treatment team. Special instructions: [facility] - Preparing for Surgery  Before surgery, you can play an important role.  Because skin is not sterile, your skin needs to be as free of germs as possible.  You can reduce the number of germs on you skin by washing with CHG (chlorahexidine gluconate) soap  before surgery.  CHG is an antiseptic cleaner which kills germs and bonds with the skin to continue killing germs even after washing.  Please DO NOT use if you have an allergy to CHG or antibacterial soaps.  If your skin becomes reddened/irritated stop using the CHG and inform your nurse when you arrive at Short Stay.  Do not shave (including legs and underarms) for at least 48 hours prior to the first CHG shower.  You may shave your face.  Please follow these instructions carefully:   1.  Shower with CHG Soap the night before surgery and the morning of Surgery.  2.  If you choose to wash your hair, wash your hair first as usual with your normal shampoo.  3.  After you shampoo, rinse your hair and body thoroughly to remove the Shampoo.  4.  Use CHG as you would any other liquid soap.  You can apply chg directly  to the skin and wash gently with scrungie or a clean washcloth.  5.  Apply the CHG Soap to your body ONLY FROM THE NECK DOWN.  Do not use on open wounds or open sores.  Avoid contact with your eyes, ears, mouth and genitals (private parts).  Wash genitals (private parts) with your normal soap.  6.  Wash thoroughly, paying special attention to the area where your surgery will be performed.  7.  Thoroughly rinse your body with warm water from the neck down.  8.  DO NOT shower/wash with your normal soap after using and rinsing off the CHG Soap.  9.  Pat yourself dry with a clean towel.            10.  Wear clean pajamas.            11.  Place clean sheets on your bed the night of your first shower and do not sleep with pets.  Day of Surgery  Do not apply any lotions/deodorants the morning of surgery.  Please wear clean clothes to the hospital/surgery center.  Please read over the following fact sheets that you were given. Pain Booklet, Coughing and Deep Breathing, Blood Transfusion Information, Total Joint Packet, MRSA Information and Surgical Site Infection Prevention

## 2016-11-03 NOTE — Progress Notes (Signed)
Pt denies SOB, chest pain, and being under the care of a cardiologist. Pt denies having a stress test, echo and cardiac cath. Pt stated that an EKG and chest x ray was completed recently ( within the last year) at [facility] along with carotid studies; records requested. Pt PCP is Dr. Wynelle LinkSun. [facility]. Spoke with Joni ReiningNicole who verified with "Rosanne AshingJim " that it is okay to do EKG and chest x ray on DOS. Joni Reiningicole also made aware of abnormal UA. Pt chart forwarded to anesthesia for review.

## 2016-11-08 NOTE — H&P (Signed)
TOTAL KNEE ADMISSION H&P  Patient is being admitted for right total knee arthroplasty.  Subjective:  Chief Complaint:right knee pain.  HPI: [name], 66 y.o. female, has a history of pain and functional disability in the right knee due to arthritis and has failed non-surgical conservative treatments for greater than 12 weeks to includeNSAID's and/or analgesics, corticosteriod injections, viscosupplementation injections, flexibility and strengthening excercises, use of assistive devices and activity modification.  Onset of symptoms was gradual, starting 4 years ago with gradually worsening course since that time. The patient noted no past surgery on the right knee(s).  Patient currently rates pain in the right knee(s) at 9 out of 10 with activity. Patient has night pain, worsening of pain with activity and weight bearing, pain that interferes with activities of daily living, pain with passive range of motion, crepitus and joint swelling.  Patient has evidence of subchondral cysts, subchondral sclerosis, periarticular osteophytes, joint subluxation and joint space narrowing by imaging studies. This patient has had failure of all reasonable conservative care.  The patient has a history of seizure disorder and family history reaction to  anesthesia.  She has had issues with benzodiazepines in the past.  All of this leaves me to believe that she'll be in the hospital for greater than 2 midnights.. There is no active infection.  Patient Active Problem List   Diagnosis Date Noted  . 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 Seizure ([place]) 03/06/2014  . Urinary tract infection, site not specified 03/06/2014  . Osteoarthritis of both knees 01/13/2012  . Bipolar 1 disorder ([place]) 01/13/2012  . Hypothyroid 01/13/2012  . Tobacco use 01/13/2012   Past Medical History:  Diagnosis Date  . Anemia   . Anxiety   . Asthma   . 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  Bipolar disorder ([place])   . Carotid stenosis, bilateral     " mild 39%"  . Depression   . Family history of adverse reaction to anesthesia    sister had PONV  . Full dentures   . GERD (gastroesophageal reflux disease)   . H/O hiatal hernia   . Headache   . Hypothyroidism   . Inflammatory polyps of colon ([place])    pt notes flat and is to have surgery to remove   . 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 Osteoarthritis of both knees   . Pneumonia   . Seizure ([place]) 03/06/2014   tonic clonic seizure which lasted about a minute    Past Surgical History:  Procedure Laterality Date  . CATARACT EXTRACTION W/ INTRAOCULAR LENS  IMPLANT, BILATERAL Bilateral   . MULTIPLE TOOTH EXTRACTIONS    . ROTATOR CUFF REPAIR Right   . TUBAL LIGATION    . VAGINAL HYSTERECTOMY  01/2003   lap assisted w/BSO/notes 12/23/2010    No prescriptions prior to admission.   Allergies  Allergen Reactions  . Cortizone-10 [Hydrocortisone] Rash  . Lamictal [Lamotrigine] Rash    Social History  Substance Use Topics  . Smoking status: Current Every Day Smoker    Packs/day: 0.50    Years: 44.00    Types: Cigarettes  . Smokeless tobacco: Never Used  . Alcohol use No    Family History  Problem Relation Age of Onset  . Cancer Mother   . Cancer Brother   . Cancer Brother      ROS ROS: I have reviewed the patient's review of systems thoroughly and there are no positive responses as relates to the  HPI. Objective:  Physical Exam  Vital signs in last 24 hours:   Well-developed well-nourished patient in no acute distress. Alert and oriented x3 HEENT:within normal limits Cardiac: Regular rate and rhythm Pulmonary: Lungs clear to auscultation Abdomen: Soft and nontender.  Normal active bowel sounds  Musculoskeletal: (right knee: Obvious valgus malalignment.  No instability.  Trace effusion.  Grinding the range of motion.  Patellofemoral crepitus. Labs: Recent Results (from the past 2160 hour(s))  Surgical pcr  screen     Status: Abnormal   Collection Time: 11/03/16 10:21 AM  Result Value Ref Range   MRSA, PCR NEGATIVE NEGATIVE   Staphylococcus aureus POSITIVE (A) NEGATIVE    Comment:        The Xpert SA Assay (FDA approved for NASAL specimens in patients over 72 years of age), is one component of a comprehensive surveillance program.  Test performance has been validated by [facility] for patients greater than or equal to 66 year old. It is not intended to diagnose infection nor to guide or monitor treatment.   Urinalysis, Routine w reflex microscopic     Status: Abnormal   Collection Time: 11/03/16 10:27 AM  Result Value Ref Range   Color, Urine YELLOW YELLOW   APPearance CLOUDY (A) CLEAR   Specific Gravity, Urine 1.015 1.005 - 1.030   pH 5.5 5.0 - 8.0   Glucose, UA NEGATIVE NEGATIVE mg/dL   Hgb urine dipstick TRACE (A) NEGATIVE   Bilirubin Urine NEGATIVE NEGATIVE   Ketones, ur NEGATIVE NEGATIVE mg/dL   Protein, ur NEGATIVE NEGATIVE mg/dL   Nitrite POSITIVE (A) NEGATIVE   Leukocytes, UA SMALL (A) NEGATIVE  Urinalysis, Microscopic (reflex)     Status: Abnormal   Collection Time: 11/03/16 10:27 AM  Result Value Ref Range   RBC / HPF NONE SEEN 0 - 5 RBC/hpf   WBC, UA 0-5 0 - 5 WBC/hpf   Bacteria, UA MANY (A) NONE SEEN   Squamous Epithelial / LPF 6-30 (A) NONE SEEN  APTT     Status: None   Collection Time: 11/03/16 10:28 AM  Result Value Ref Range   aPTT 32 24 - 36 seconds  CBC WITH DIFFERENTIAL     Status: None   Collection Time: 11/03/16 10:28 AM  Result Value Ref Range   WBC 6.0 4.0 - 10.5 K/uL   RBC 4.27 3.87 - 5.11 MIL/uL   Hemoglobin 12.8 12.0 - 15.0 g/dL   HCT 40.1 36.0 - 46.0 %   MCV 93.9 78.0 - 100.0 fL   MCH 30.0 26.0 - 34.0 pg   MCHC 31.9 30.0 - 36.0 g/dL   RDW 14.8 11.5 - 15.5 %   Platelets 372 150 - 400 K/uL   Neutrophils Relative % 60 %   Neutro Abs 3.6 1.7 - 7.7 K/uL   Lymphocytes Relative 27 %   Lymphs Abs 1.6 0.7 - 4.0 K/uL   Monocytes Relative 8 %    Monocytes Absolute 0.5 0.1 - 1.0 K/uL   Eosinophils Relative 4 %   Eosinophils Absolute 0.2 0.0 - 0.7 K/uL   Basophils Relative 1 %   Basophils Absolute 0.1 0.0 - 0.1 K/uL  Comprehensive metabolic panel     Status: Abnormal   Collection Time: 11/03/16 10:28 AM  Result Value Ref Range   Sodium 138 135 - 145 mmol/L   Potassium 4.2 3.5 - 5.1 mmol/L   Chloride 103 101 - 111 mmol/L   CO2 25 22 - 32 mmol/L   Glucose, Bld 105 (H)  65 - 99 mg/dL   BUN 8 6 - 20 mg/dL   Creatinine, Ser 1.23 (H) 0.44 - 1.00 mg/dL   Calcium 9.4 8.9 - 10.3 mg/dL   Total Protein 7.2 6.5 - 8.1 g/dL   Albumin 4.1 3.5 - 5.0 g/dL   AST 18 15 - 41 U/L   ALT 11 (L) 14 - 54 U/L   Alkaline Phosphatase 139 (H) 38 - 126 U/L   Total Bilirubin 0.7 0.3 - 1.2 mg/dL   GFR calc non Af Amer 45 (L) >60 mL/min   GFR calc Af Amer 52 (L) >60 mL/min    Comment: (NOTE) The eGFR has been calculated using the CKD EPI equation. This calculation has not been validated in all clinical situations. eGFR's persistently <60 mL/min signify possible Chronic Kidney Disease.    Anion gap 10 5 - 15  Protime-INR     Status: None   Collection Time: 11/03/16 10:28 AM  Result Value Ref Range   Prothrombin Time 13.2 11.4 - 15.2 seconds   INR 1.00   Type and screen Order type and screen if day of surgery is less than 15 days from draw of preadmission visit or order morning of surgery if day of surgery is greater than 6 days from preadmission visit.     Status: None   Collection Time: 11/03/16 10:30 AM  Result Value Ref Range   ABO/RH(D) O POS    Antibody Screen NEG    Sample Expiration 11/17/2016    Extend sample reason NO TRANSFUSIONS OR PREGNANCY IN THE PAST 3 MONTHS   ABO/Rh     Status: None   Collection Time: 11/03/16 10:30 AM  Result Value Ref Range   ABO/RH(D) O POS     Estimated body mass index is 27.29 kg/m as calculated from the following:   Height as of 11/03/16: 5' 3.5" (1.613 m).   Weight as of 11/03/16: 71 kg (156 lb 8  oz).   Imaging Review Plain radiographs demonstrate severe degenerative joint disease of the right knee(s). The overall alignment issignificant valgus. The bone quality appears to be fair for age and reported activity level.  Assessment/Plan:  End stage arthritis, right knee   The patient history, physical examination, clinical judgment of the provider and imaging studies are consistent with end stage degenerative joint disease of the right knee(s) and total knee arthroplasty is deemed medically necessary. The treatment options including medical management, injection therapy arthroscopy and arthroplasty were discussed at length. The risks and benefits of total knee arthroplasty were presented and reviewed. The risks due to aseptic loosening, infection, stiffness, patella tracking problems, thromboembolic complications and other imponderables were discussed. The patient acknowledged the explanation, agreed to proceed with the plan and consent was signed. Patient is being admitted for inpatient treatment for surgery, pain control, PT, OT, prophylactic antibiotics, VTE prophylaxis, progressive ambulation and ADL's and discharge planning. The patient is planning to be discharged to skilled nursing facility

## 2016-11-08 NOTE — Anesthesia Preprocedure Evaluation (Addendum)
Anesthesia Evaluation    Reviewed: Allergy & Precautions, NPO status , Patient's Chart, lab work & pertinent test results  History of Anesthesia Complications (+) Family history of anesthesia reaction  Airway Mallampati: II  TM Distance: >3 FB     Dental  (+) Upper Dentures, Lower Dentures   Pulmonary asthma , pneumonia, resolved, Current Smoker,    breath sounds clear to auscultation       Cardiovascular + Peripheral Vascular Disease   Rhythm:Regular Rate:Normal     Neuro/Psych  Headaches, Seizures -,  PSYCHIATRIC DISORDERS Anxiety Depression Bipolar Disorder    GI/Hepatic hiatal hernia, GERD  Medicated and Controlled,  Endo/Other  Hypothyroidism   Renal/GU      Musculoskeletal  (+) Arthritis , Osteoarthritis,    Abdominal   Peds  Hematology  (+) anemia ,   Anesthesia Other Findings   Reproductive/Obstetrics                            Anesthesia Physical Anesthesia Plan  ASA: III  Anesthesia Plan: Spinal   Post-op Pain Management:  Regional for Post-op pain   Induction:   Airway Management Planned:   Additional Equipment:   Intra-op Plan:   Post-operative Plan:   Informed Consent: I have reviewed the patients History and Physical, chart, labs and discussed the procedure including the risks, benefits and alternatives for the proposed anesthesia with the patient or authorized representative who has indicated his/her understanding and acceptance.   Dental advisory given  Plan Discussed with: CRNA  Anesthesia Plan Comments: (EKG reviewed, apprears unchanged sicne 2014 as viewed by me. Carotid studies reviewed - 39% stenoses Right and left ICA)       Anesthesia Quick Evaluation

## 2016-11-09 ENCOUNTER — Encounter (HOSPITAL_COMMUNITY)
Admission: RE | Disposition: A | Discharge status: Skilled Nursing Facility | Payer: Self-pay | Source: Ambulatory Visit | Attending: Orthopedic Surgery

## 2016-11-09 ENCOUNTER — Inpatient Hospital Stay (HOSPITAL_COMMUNITY): Payer: Medicare Other | Admitting: Emergency Medicine

## 2016-11-09 ENCOUNTER — Inpatient Hospital Stay (HOSPITAL_COMMUNITY)
Admission: RE | Admit: 2016-11-09 | Discharge: 2016-11-11 | DRG: 470 | Disposition: A | Discharge status: Skilled Nursing Facility | Payer: Medicare Other | Source: Ambulatory Visit | Attending: Orthopedic Surgery | Admitting: Orthopedic Surgery

## 2016-11-09 ENCOUNTER — Inpatient Hospital Stay (HOSPITAL_COMMUNITY): Payer: Medicare Other | Admitting: Anesthesiology

## 2016-11-09 ENCOUNTER — Encounter (HOSPITAL_COMMUNITY): Payer: Self-pay | Admitting: *Deleted

## 2016-11-09 ENCOUNTER — Inpatient Hospital Stay (HOSPITAL_COMMUNITY): Payer: Medicare Other

## 2016-11-09 DIAGNOSIS — R339 Retention of urine, unspecified: Secondary | ICD-10-CM | POA: Diagnosis not present

## 2016-11-09 DIAGNOSIS — F319 Bipolar disorder, unspecified: Secondary | ICD-10-CM | POA: Diagnosis present

## 2016-11-09 DIAGNOSIS — E039 Hypothyroidism, unspecified: Secondary | ICD-10-CM | POA: Diagnosis present

## 2016-11-09 DIAGNOSIS — J45909 Unspecified asthma, uncomplicated: Secondary | ICD-10-CM | POA: Diagnosis present

## 2016-11-09 DIAGNOSIS — K219 Gastro-esophageal reflux disease without esophagitis: Secondary | ICD-10-CM | POA: Diagnosis present

## 2016-11-09 DIAGNOSIS — Z961 Presence of intraocular lens: Secondary | ICD-10-CM | POA: Diagnosis present

## 2016-11-09 DIAGNOSIS — M25561 Pain in right knee: Secondary | ICD-10-CM | POA: Diagnosis present

## 2016-11-09 DIAGNOSIS — M1711 Unilateral primary osteoarthritis, right knee: Secondary | ICD-10-CM | POA: Diagnosis present

## 2016-11-09 DIAGNOSIS — D62 Acute posthemorrhagic anemia: Secondary | ICD-10-CM

## 2016-11-09 DIAGNOSIS — I739 Peripheral vascular disease, unspecified: Secondary | ICD-10-CM | POA: Diagnosis present

## 2016-11-09 DIAGNOSIS — Z01818 Encounter for other preprocedural examination: Secondary | ICD-10-CM

## 2016-11-09 DIAGNOSIS — F17210 Nicotine dependence, cigarettes, uncomplicated: Secondary | ICD-10-CM | POA: Diagnosis present

## 2016-11-09 HISTORY — PX: TOTAL KNEE ARTHROPLASTY: SHX125

## 2016-11-09 SURGERY — ARTHROPLASTY, KNEE, TOTAL
Anesthesia: Spinal | Site: Knee | Laterality: Right

## 2016-11-09 MED ORDER — DEXAMETHASONE SODIUM PHOSPHATE 10 MG/ML IJ SOLN
10.0000 mg | Freq: Two times a day (BID) | INTRAMUSCULAR | Status: AC
  Administered 2016-11-09 – 2016-11-10 (×3): 10 mg via INTRAVENOUS
  Filled 2016-11-09 (×3): qty 1

## 2016-11-09 MED ORDER — BISACODYL 5 MG PO TBEC: 5.0000 mg | DELAYED_RELEASE_TABLET | Freq: Every day | ORAL | Status: DC | PRN

## 2016-11-09 MED ORDER — 0.9 % SODIUM CHLORIDE (POUR BTL) OPTIME
TOPICAL | Status: DC | PRN
  Administered 2016-11-09: 1000 mL

## 2016-11-09 MED ORDER — ACETAMINOPHEN 325 MG PO TABS: 650.0000 mg | ORAL_TABLET | Freq: Four times a day (QID) | ORAL | Status: DC | PRN

## 2016-11-09 MED ORDER — CHLORHEXIDINE GLUCONATE 4 % EX LIQD: 60.0000 mL | Freq: Once | CUTANEOUS | Status: DC

## 2016-11-09 MED ORDER — LACTATED RINGERS IV SOLN
INTRAVENOUS | Status: DC
  Administered 2016-11-09 (×3): via INTRAVENOUS

## 2016-11-09 MED ORDER — ASPIRIN EC 325 MG PO TBEC: 325.0000 mg | DELAYED_RELEASE_TABLET | Freq: Two times a day (BID) | ORAL | 0 refills | Status: AC

## 2016-11-09 MED ORDER — FENTANYL CITRATE (PF) 100 MCG/2ML IJ SOLN
50.0000 ug | Freq: Once | INTRAMUSCULAR | Status: AC
  Administered 2016-11-09: 50 ug via INTRAVENOUS

## 2016-11-09 MED ORDER — HYDROMORPHONE HCL 1 MG/ML IJ SOLN
INTRAMUSCULAR | Status: AC
  Filled 2016-11-09: qty 0.5

## 2016-11-09 MED ORDER — HYDROMORPHONE HCL 1 MG/ML IJ SOLN: 0.5000 mg | INTRAMUSCULAR | Status: DC | PRN

## 2016-11-09 MED ORDER — TIZANIDINE HCL 2 MG PO TABS: 2.0000 mg | ORAL_TABLET | Freq: Three times a day (TID) | ORAL | 0 refills | Status: AC | PRN

## 2016-11-09 MED ORDER — METHOCARBAMOL 1000 MG/10ML IJ SOLN
500.0000 mg | Freq: Four times a day (QID) | INTRAVENOUS | Status: DC | PRN
  Filled 2016-11-09: qty 5

## 2016-11-09 MED ORDER — ASPIRIN EC 325 MG PO TBEC
325.0000 mg | DELAYED_RELEASE_TABLET | Freq: Two times a day (BID) | ORAL | Status: DC
  Administered 2016-11-10 – 2016-11-11 (×2): 325 mg via ORAL
  Filled 2016-11-09 (×2): qty 1

## 2016-11-09 MED ORDER — ONDANSETRON HCL 4 MG/2ML IJ SOLN: 4.0000 mg | Freq: Four times a day (QID) | INTRAMUSCULAR | Status: DC | PRN

## 2016-11-09 MED ORDER — BUPIVACAINE HCL (PF) 0.5 % IJ SOLN
INTRAMUSCULAR | Status: AC
  Filled 2016-11-09: qty 30

## 2016-11-09 MED ORDER — ACETAMINOPHEN 650 MG RE SUPP: 650.0000 mg | Freq: Four times a day (QID) | RECTAL | Status: DC | PRN

## 2016-11-09 MED ORDER — BUPIVACAINE-EPINEPHRINE 0.5% -1:200000 IJ SOLN
INTRAMUSCULAR | Status: DC | PRN
  Administered 2016-11-09: 30 mL

## 2016-11-09 MED ORDER — GABAPENTIN 300 MG PO CAPS
300.0000 mg | ORAL_CAPSULE | Freq: Two times a day (BID) | ORAL | Status: DC
  Administered 2016-11-09 – 2016-11-11 (×5): 300 mg via ORAL
  Filled 2016-11-09 (×5): qty 1

## 2016-11-09 MED ORDER — MIDAZOLAM HCL 2 MG/2ML IJ SOLN
INTRAMUSCULAR | Status: AC
  Administered 2016-11-09: 1 mg via INTRAVENOUS
  Filled 2016-11-09: qty 2

## 2016-11-09 MED ORDER — QUETIAPINE FUMARATE 400 MG PO TABS
800.0000 mg | ORAL_TABLET | Freq: Every day | ORAL | Status: DC
  Administered 2016-11-09 – 2016-11-11 (×3): 800 mg via ORAL
  Filled 2016-11-09 (×3): qty 2

## 2016-11-09 MED ORDER — CEFAZOLIN SODIUM-DEXTROSE 2-4 GM/100ML-% IV SOLN
2.0000 g | Freq: Four times a day (QID) | INTRAVENOUS | Status: AC
  Administered 2016-11-10: 2 g via INTRAVENOUS
  Filled 2016-11-09 (×2): qty 100

## 2016-11-09 MED ORDER — DIPHENHYDRAMINE HCL 12.5 MG/5ML PO ELIX: 12.5000 mg | ORAL_SOLUTION | ORAL | Status: DC | PRN

## 2016-11-09 MED ORDER — LEVOTHYROXINE SODIUM 88 MCG PO TABS
88.0000 ug | ORAL_TABLET | Freq: Every day | ORAL | Status: DC
  Administered 2016-11-10 – 2016-11-11 (×2): 88 ug via ORAL
  Filled 2016-11-09 (×2): qty 1

## 2016-11-09 MED ORDER — BUPIVACAINE LIPOSOME 1.3 % IJ SUSP
INTRAMUSCULAR | Status: DC | PRN
  Administered 2016-11-09: 20 mL

## 2016-11-09 MED ORDER — CLONAZEPAM 0.5 MG PO TABS
0.5000 mg | ORAL_TABLET | Freq: Three times a day (TID) | ORAL | Status: DC
Start: 2016-11-09 — End: 2016-11-11
  Administered 2016-11-09 – 2016-11-11 (×6): 0.5 mg via ORAL
  Filled 2016-11-09 (×6): qty 1

## 2016-11-09 MED ORDER — ONDANSETRON HCL 4 MG/2ML IJ SOLN
INTRAMUSCULAR | Status: DC | PRN
Start: 2016-11-09 — End: 2016-11-09
  Administered 2016-11-09: 4 mg via INTRAVENOUS

## 2016-11-09 MED ORDER — ONDANSETRON HCL 4 MG PO TABS: 4.0000 mg | ORAL_TABLET | Freq: Four times a day (QID) | ORAL | Status: DC | PRN

## 2016-11-09 MED ORDER — SODIUM CHLORIDE 0.9 % IR SOLN
Status: DC | PRN
  Administered 2016-11-09: 3000 mL

## 2016-11-09 MED ORDER — TRANEXAMIC ACID 1000 MG/10ML IV SOLN
1000.0000 mg | INTRAVENOUS | Status: AC
  Administered 2016-11-09: 1000 mg via INTRAVENOUS
  Filled 2016-11-09: qty 10

## 2016-11-09 MED ORDER — CEFAZOLIN SODIUM-DEXTROSE 2-4 GM/100ML-% IV SOLN
2.0000 g | INTRAVENOUS | Status: AC
  Administered 2016-11-09: 2 g via INTRAVENOUS

## 2016-11-09 MED ORDER — OXYCODONE-ACETAMINOPHEN 5-325 MG PO TABS: 1.0000 | ORAL_TABLET | Freq: Four times a day (QID) | ORAL | 0 refills | Status: AC | PRN

## 2016-11-09 MED ORDER — PAROXETINE HCL 20 MG PO TABS
40.0000 mg | ORAL_TABLET | Freq: Every day | ORAL | Status: DC
  Administered 2016-11-10 – 2016-11-11 (×2): 40 mg via ORAL
  Filled 2016-11-09 (×2): qty 2

## 2016-11-09 MED ORDER — CEFAZOLIN SODIUM-DEXTROSE 2-4 GM/100ML-% IV SOLN
INTRAVENOUS | Status: AC
  Filled 2016-11-09: qty 100

## 2016-11-09 MED ORDER — TRANEXAMIC ACID 1000 MG/10ML IV SOLN
1000.0000 mg | Freq: Once | INTRAVENOUS | Status: DC
  Filled 2016-11-09: qty 10

## 2016-11-09 MED ORDER — OXYCODONE HCL 5 MG PO TABS
5.0000 mg | ORAL_TABLET | ORAL | Status: DC | PRN
  Administered 2016-11-09 – 2016-11-10 (×4): 10 mg via ORAL
  Filled 2016-11-09 (×4): qty 2

## 2016-11-09 MED ORDER — ALBUTEROL 90 MCG/ACT IN AERS: 2.0000 | INHALATION_SPRAY | RESPIRATORY_TRACT | Status: DC | PRN

## 2016-11-09 MED ORDER — BUPIVACAINE-EPINEPHRINE (PF) 0.5% -1:200000 IJ SOLN
INTRAMUSCULAR | Status: DC | PRN
  Administered 2016-11-09: 20 mL via PERINEURAL

## 2016-11-09 MED ORDER — FENTANYL CITRATE (PF) 100 MCG/2ML IJ SOLN
INTRAMUSCULAR | Status: AC
  Administered 2016-11-09: 50 ug via INTRAVENOUS
  Filled 2016-11-09: qty 2

## 2016-11-09 MED ORDER — MIDAZOLAM HCL 2 MG/2ML IJ SOLN
1.0000 mg | Freq: Once | INTRAMUSCULAR | Status: AC
  Administered 2016-11-09: 1 mg via INTRAVENOUS

## 2016-11-09 MED ORDER — EPINEPHRINE PF 1 MG/ML IJ SOLN
INTRAMUSCULAR | Status: AC
  Filled 2016-11-09: qty 1

## 2016-11-09 MED ORDER — HYDROMORPHONE HCL 1 MG/ML IJ SOLN
0.2500 mg | INTRAMUSCULAR | Status: DC | PRN
  Administered 2016-11-09 (×2): 0.5 mg via INTRAVENOUS

## 2016-11-09 MED ORDER — ALBUTEROL SULFATE (2.5 MG/3ML) 0.083% IN NEBU: 2.5000 mg | INHALATION_SOLUTION | RESPIRATORY_TRACT | Status: DC | PRN

## 2016-11-09 MED ORDER — MAGNESIUM CITRATE PO SOLN: 1.0000 | Freq: Once | ORAL | Status: DC | PRN

## 2016-11-09 MED ORDER — MIDAZOLAM HCL 2 MG/2ML IJ SOLN
INTRAMUSCULAR | Status: AC
  Filled 2016-11-09: qty 2

## 2016-11-09 MED ORDER — POLYETHYLENE GLYCOL 3350 17 G PO PACK: 17.0000 g | PACK | Freq: Every day | ORAL | Status: DC | PRN

## 2016-11-09 MED ORDER — FENTANYL CITRATE (PF) 250 MCG/5ML IJ SOLN
INTRAMUSCULAR | Status: AC
  Filled 2016-11-09: qty 5

## 2016-11-09 MED ORDER — ATORVASTATIN CALCIUM 10 MG PO TABS
10.0000 mg | ORAL_TABLET | Freq: Every day | ORAL | Status: DC
  Administered 2016-11-09 – 2016-11-11 (×3): 10 mg via ORAL
  Filled 2016-11-09 (×3): qty 1

## 2016-11-09 MED ORDER — MIDAZOLAM HCL 5 MG/5ML IJ SOLN
INTRAMUSCULAR | Status: DC | PRN
  Administered 2016-11-09 (×2): 1 mg via INTRAVENOUS

## 2016-11-09 MED ORDER — DOCUSATE SODIUM 100 MG PO CAPS
100.0000 mg | ORAL_CAPSULE | Freq: Two times a day (BID) | ORAL | Status: DC
Start: 2016-11-09 — End: 2016-11-11
  Administered 2016-11-09 – 2016-11-11 (×5): 100 mg via ORAL
  Filled 2016-11-09 (×5): qty 1

## 2016-11-09 MED ORDER — DOCUSATE SODIUM 100 MG PO CAPS: 100.0000 mg | ORAL_CAPSULE | Freq: Two times a day (BID) | ORAL | 0 refills | Status: AC

## 2016-11-09 MED ORDER — ALUM & MAG HYDROXIDE-SIMETH 200-200-20 MG/5ML PO SUSP: 30.0000 mL | ORAL | Status: DC | PRN

## 2016-11-09 MED ORDER — FENTANYL CITRATE (PF) 100 MCG/2ML IJ SOLN
INTRAMUSCULAR | Status: DC | PRN
  Administered 2016-11-09: 50 ug via INTRAVENOUS
  Administered 2016-11-09 (×2): 25 ug via INTRAVENOUS
  Administered 2016-11-09: 50 ug via INTRAVENOUS
  Administered 2016-11-09: 25 ug via INTRAVENOUS
  Administered 2016-11-09: 50 ug via INTRAVENOUS
  Administered 2016-11-09: 25 ug via INTRAVENOUS

## 2016-11-09 MED ORDER — BUPIVACAINE LIPOSOME 1.3 % IJ SUSP
20.0000 mL | INTRAMUSCULAR | Status: DC
  Filled 2016-11-09: qty 20

## 2016-11-09 MED ORDER — SODIUM CHLORIDE 0.9 % IV SOLN
INTRAVENOUS | Status: DC
  Administered 2016-11-10: 100 mL/h via INTRAVENOUS
  Administered 2016-11-11: 08:00:00 via INTRAVENOUS

## 2016-11-09 MED ORDER — BUPIVACAINE IN DEXTROSE 0.75-8.25 % IT SOLN
INTRATHECAL | Status: DC | PRN
  Administered 2016-11-09: 1.4 mL via INTRATHECAL

## 2016-11-09 MED ORDER — ONDANSETRON HCL 4 MG/2ML IJ SOLN: 4.0000 mg | Freq: Once | INTRAMUSCULAR | Status: DC | PRN

## 2016-11-09 MED ORDER — PANTOPRAZOLE SODIUM 40 MG PO TBEC
80.0000 mg | DELAYED_RELEASE_TABLET | Freq: Every day | ORAL | Status: DC
  Administered 2016-11-10 – 2016-11-11 (×2): 80 mg via ORAL
  Filled 2016-11-09 (×2): qty 2

## 2016-11-09 MED ORDER — METHOCARBAMOL 500 MG PO TABS: 500.0000 mg | ORAL_TABLET | Freq: Four times a day (QID) | ORAL | Status: DC | PRN

## 2016-11-09 MED ORDER — PROPOFOL 1000 MG/100ML IV EMUL
INTRAVENOUS | Status: AC
  Filled 2016-11-09: qty 100

## 2016-11-09 MED ORDER — MEPERIDINE HCL 25 MG/ML IJ SOLN: 6.2500 mg | INTRAMUSCULAR | Status: DC | PRN

## 2016-11-09 MED ORDER — PROPOFOL 500 MG/50ML IV EMUL
INTRAVENOUS | Status: DC | PRN
  Administered 2016-11-09: 75 ug/kg/min via INTRAVENOUS

## 2016-11-09 MED ORDER — PROMETHAZINE HCL 25 MG/ML IJ SOLN: 12.5000 mg | Freq: Four times a day (QID) | INTRAMUSCULAR | Status: DC | PRN

## 2016-11-09 MED ORDER — CELECOXIB 200 MG PO CAPS
200.0000 mg | ORAL_CAPSULE | Freq: Two times a day (BID) | ORAL | Status: DC
  Administered 2016-11-09 – 2016-11-11 (×5): 200 mg via ORAL
  Filled 2016-11-09 (×5): qty 1

## 2016-11-09 SURGICAL SUPPLY — 66 items
APL SKNCLS STERI-STRIP NONHPOA (GAUZE/BANDAGES/DRESSINGS) ×1
BANDAGE ELASTIC 6 VELCRO ST LF (GAUZE/BANDAGES/DRESSINGS) ×2 IMPLANT
BANDAGE ESMARK 6X9 LF (GAUZE/BANDAGES/DRESSINGS) ×1 IMPLANT
BENZOIN TINCTURE PRP APPL 2/3 (GAUZE/BANDAGES/DRESSINGS) ×3 IMPLANT
BLADE SAGITTAL 25.0X1.19X90 (BLADE) ×2 IMPLANT
BLADE SAGITTAL 25.0X1.19X90MM (BLADE) ×1
BLADE SAW SAG 90X13X1.27 (BLADE) ×3 IMPLANT
BNDG CMPR 9X6 STRL LF SNTH (GAUZE/BANDAGES/DRESSINGS) ×1
BNDG ESMARK 6X9 LF (GAUZE/BANDAGES/DRESSINGS) ×3
BOWL SMART MIX CTS (DISPOSABLE) ×3 IMPLANT
CAPT KNEE TOTAL 3 ATTUNE ×2 IMPLANT
CEMENT HV SMART SET (Cement) ×6 IMPLANT
CLOSURE WOUND 1/2 X4 (GAUZE/BANDAGES/DRESSINGS) ×1
COVER SURGICAL LIGHT HANDLE (MISCELLANEOUS) ×3 IMPLANT
CUFF TOURNIQUET SINGLE 34IN LL (TOURNIQUET CUFF) ×3 IMPLANT
CUFF TOURNIQUET SINGLE 44IN (TOURNIQUET CUFF) IMPLANT
DRAPE EXTREMITY T 121X128X90 (DRAPE) ×3 IMPLANT
DRAPE U-SHAPE 47X51 STRL (DRAPES) ×3 IMPLANT
DRSG AQUACEL AG ADV 3.5X10 (GAUZE/BANDAGES/DRESSINGS) ×2 IMPLANT
DRSG PAD ABDOMINAL 8X10 ST (GAUZE/BANDAGES/DRESSINGS) ×3 IMPLANT
DURAPREP 26ML APPLICATOR (WOUND CARE) ×3 IMPLANT
ELECT REM PT RETURN 9FT ADLT (ELECTROSURGICAL) ×3
ELECTRODE REM PT RTRN 9FT ADLT (ELECTROSURGICAL) ×1 IMPLANT
EVACUATOR 1/8 PVC DRAIN (DRAIN) IMPLANT
FACESHIELD WRAPAROUND (MASK) IMPLANT
FACESHIELD WRAPAROUND OR TEAM (MASK) ×1 IMPLANT
GAUZE SPONGE 4X4 12PLY STRL (GAUZE/BANDAGES/DRESSINGS) ×3 IMPLANT
GLOVE BIOGEL PI IND STRL 8 (GLOVE) ×2 IMPLANT
GLOVE BIOGEL PI INDICATOR 8 (GLOVE) ×4
GLOVE ECLIPSE 7.5 STRL STRAW (GLOVE) ×6 IMPLANT
GOWN STRL REUS W/ TWL LRG LVL3 (GOWN DISPOSABLE) ×1 IMPLANT
GOWN STRL REUS W/ TWL XL LVL3 (GOWN DISPOSABLE) ×2 IMPLANT
GOWN STRL REUS W/TWL LRG LVL3 (GOWN DISPOSABLE) ×3
GOWN STRL REUS W/TWL XL LVL3 (GOWN DISPOSABLE) ×6
HANDPIECE INTERPULSE COAX TIP (DISPOSABLE) ×3
HOOD PEEL AWAY FACE SHEILD DIS (HOOD) ×6 IMPLANT
IMMOBILIZER KNEE 22 UNIV (SOFTGOODS) ×3 IMPLANT
KIT BASIN OR (CUSTOM PROCEDURE TRAY) ×3 IMPLANT
KIT ROOM TURNOVER OR (KITS) ×3 IMPLANT
MANIFOLD NEPTUNE II (INSTRUMENTS) ×3 IMPLANT
NDL FILTER BLUNT 18X1 1/2 (NEEDLE) IMPLANT
NEEDLE 22X1 1/2 (OR ONLY) (NEEDLE) ×3 IMPLANT
NEEDLE FILTER BLUNT 18X 1/2SAF (NEEDLE) ×2
NEEDLE FILTER BLUNT 18X1 1/2 (NEEDLE) ×1 IMPLANT
NS IRRIG 1000ML POUR BTL (IV SOLUTION) ×3 IMPLANT
PACK TOTAL JOINT (CUSTOM PROCEDURE TRAY) ×3 IMPLANT
PAD ARMBOARD 7.5X6 YLW CONV (MISCELLANEOUS) ×6 IMPLANT
PADDING CAST COTTON 6X4 STRL (CAST SUPPLIES) ×2 IMPLANT
SET HNDPC FAN SPRY TIP SCT (DISPOSABLE) ×1 IMPLANT
STAPLER VISISTAT 35W (STAPLE) IMPLANT
STRIP CLOSURE SKIN 1/2X4 (GAUZE/BANDAGES/DRESSINGS) ×1 IMPLANT
SUCTION FRAZIER HANDLE 10FR (MISCELLANEOUS) ×2
SUCTION TUBE FRAZIER 10FR DISP (MISCELLANEOUS) ×1 IMPLANT
SUT MNCRL AB 3-0 PS2 18 (SUTURE) IMPLANT
SUT VIC AB 0 CTB1 27 (SUTURE) ×6 IMPLANT
SUT VIC AB 1 CT1 27 (SUTURE) ×6
SUT VIC AB 1 CT1 27XBRD ANBCTR (SUTURE) ×2 IMPLANT
SUT VIC AB 2-0 CTB1 (SUTURE) ×6 IMPLANT
SYR 50ML LL SCALE MARK (SYRINGE) ×3 IMPLANT
SYR TB 1ML LUER SLIP (SYRINGE) ×1 IMPLANT
TOWEL OR 17X24 6PK STRL BLUE (TOWEL DISPOSABLE) ×3 IMPLANT
TOWEL OR 17X26 10 PK STRL BLUE (TOWEL DISPOSABLE) ×3 IMPLANT
TRAY CATH 16FR W/PLASTIC CATH (SET/KITS/TRAYS/PACK) ×2 IMPLANT
TRAY FOLEY W/METER SILVER 16FR (SET/KITS/TRAYS/PACK) IMPLANT
WRAP KNEE MAXI GEL POST OP (GAUZE/BANDAGES/DRESSINGS) ×3 IMPLANT
YANKAUER SUCT BULB TIP NO VENT (SUCTIONS) ×2 IMPLANT

## 2016-11-09 NOTE — Progress Notes (Signed)
Orthopedic Tech Progress Note Patient Details:  [name] [birth year] [id number]  CPM Right Knee CPM Right Knee: On Right Knee Flexion (Degrees): 70 Right Knee Extension (Degrees): 0 Additional Comments: foot roll   Saul Fordyce 11/09/2016, 4:19 PM

## 2016-11-09 NOTE — Discharge Instructions (Signed)

## 2016-11-09 NOTE — Transfer of Care (Cosign Needed)
Immediate Anesthesia Transfer of Care Note  Patient: [name]  Procedure(s) Performed: Procedure(s): RIGHT TOTAL KNEE ARTHROPLASTY (Right)  Patient Location: PACU  Anesthesia Type:MAC, Regional and Spinal  Level of Consciousness: awake, alert , oriented and patient cooperative  Airway & Oxygen Therapy: Patient Spontanous Breathing  Post-op Assessment: Report given to RN, Post -op Vital signs reviewed and stable and Patient moving all extremities  Post vital signs: Reviewed and stable  Last Vitals:  Vitals:   11/09/16 1225 11/09/16 1230  BP: (!) 139/104 (!) 142/93  Pulse: 100 (!) 102  Resp: 15 18  Temp:      Last Pain:  Vitals:   11/09/16 1230  TempSrc:   PainSc: 0-No pain         Complications: No apparent anesthesia complications

## 2016-11-09 NOTE — Anesthesia Postprocedure Evaluation (Signed)
Anesthesia Post Note  Patient: [name]  Procedure(s) Performed: Procedure(s) (LRB): RIGHT TOTAL KNEE ARTHROPLASTY (Right)  Patient location during evaluation: PACU Anesthesia Type: Spinal Level of consciousness: awake Pain management: pain level controlled Vital Signs Assessment: post-procedure vital signs reviewed and stable Respiratory status: spontaneous breathing Cardiovascular status: stable Postop Assessment: spinal receding Anesthetic complications: no       Last Vitals:  Vitals:   11/09/16 1230 11/09/16 1534  BP: (!) 142/93 (!) 153/103  Pulse: (!) 102 (!) 108  Resp: 18 (!) 22  Temp:      Last Pain:  Vitals:   11/09/16 1230  TempSrc:   PainSc: 0-No pain                 Arma Heading

## 2016-11-09 NOTE — Anesthesia Procedure Notes (Signed)
Anesthesia Regional Block: Adductor canal block   Pre-Anesthetic Checklist: ,, timeout performed, Correct Patient, Correct Site, Correct Laterality, Correct Procedure, Correct Position, site marked, Risks and benefits discussed,  Surgical consent,  Pre-op evaluation,  At surgeon's request and post-op pain management  Laterality: Right  Prep: chloraprep       Needles:  Injection technique: Single-shot  Needle Type: Stimulator Needle - 40     Needle Length: 5cm  Needle Gauge: 22     Additional Needles:   Procedures: ultrasound guided,,,,,,,,  Narrative:  Start time: 11/09/2016 12:18 PM End time: 11/09/2016 12:28 PM  Performed by: Personally  Anesthesiologist: Jethro Bolus, Diangelo Radel

## 2016-11-09 NOTE — Anesthesia Procedure Notes (Signed)
Spinal  Start time: 11/09/2016 1:20 PM End time: 11/09/2016 1:26 PM Staffing Anesthesiologist: Arma Heading Performed: anesthesiologist  Preanesthetic Checklist Completed: patient identified, site marked, surgical consent, pre-op evaluation, timeout performed, risks and benefits discussed and monitors and equipment checked Spinal Block Patient position: sitting Prep: Betadine Patient monitoring: heart rate, cardiac monitor, continuous pulse ox and blood pressure Approach: midline Location: L3-4 Injection technique: single-shot Needle Needle type: [place]  Needle gauge: 24 G Needle length: 10 cm Assessment Sensory level: T6 Additional Notes Assisted by Andre Lefort

## 2016-11-09 NOTE — Brief Op Note (Signed)
11/09/2016  2:54 PM  PATIENT:  [name]  66 y.o. female  PRE-OPERATIVE DIAGNOSIS:  OSTEOARTHRITIS RIGHT KNEE  POST-OPERATIVE DIAGNOSIS:  OSTEOARTHRITIS RIGHT KNEE  PROCEDURE:  Procedure(s): RIGHT TOTAL KNEE ARTHROPLASTY (Right)  SURGEON:  Surgeon(s) and Role:    * Jodi Geralds, MD - Primary  PHYSICIAN ASSISTANT:   ASSISTANTS: bethune   ANESTHESIA:   spinal  EBL:  Total I/O In: 1000 [I.V.:1000] Out: 75 [Blood:75]  BLOOD ADMINISTERED:none  DRAINS: none   LOCAL MEDICATIONS USED:  MARCAINE    and OTHER experel  SPECIMEN:  No Specimen  DISPOSITION OF SPECIMEN:  N/A  COUNTS:  YES  TOURNIQUET:   Total Tourniquet Time Documented: Thigh (Right) - 66 minutes Total: Thigh (Right) - 66 minutes   DICTATION: .Other Dictation: Dictation Number [id number]  PLAN OF CARE: Admit to inpatient   PATIENT DISPOSITION:  PACU - hemodynamically stable.   Delay start of Pharmacological VTE agent (>24hrs) due to surgical blood loss or risk of bleeding: no

## 2016-11-10 ENCOUNTER — Encounter (HOSPITAL_COMMUNITY): Payer: Self-pay | Admitting: Orthopedic Surgery

## 2016-11-10 LAB — BASIC METABOLIC PANEL
Anion gap: 9 (ref 5–15)
BUN: 5 mg/dL — AB (ref 6–20)
CALCIUM: 8.7 mg/dL — AB (ref 8.9–10.3)
CO2: 23 mmol/L (ref 22–32)
CREATININE: 1.01 mg/dL — AB (ref 0.44–1.00)
Chloride: 103 mmol/L (ref 101–111)
GFR calc Af Amer: >60 mL/min (ref >60)
GFR, EST NON AFRICAN AMERICAN: 57 mL/min — AB (ref >60)
GLUCOSE: 137 mg/dL — AB (ref 65–99)
Potassium: 3.4 mmol/L — ABNORMAL LOW (ref 3.5–5.1)
SODIUM: 135 mmol/L (ref 135–145)

## 2016-11-10 LAB — CBC
HCT: 33.3 % — ABNORMAL LOW (ref 36.0–46.0)
HEMOGLOBIN: 11 g/dL — AB (ref 12.0–15.0)
MCH: 30.3 pg (ref 26.0–34.0)
MCHC: 33 g/dL (ref 30.0–36.0)
MCV: 91.7 fL (ref 78.0–100.0)
Platelets: 301 10*3/uL (ref 150–400)
RBC: 3.63 MIL/uL — AB (ref 3.87–5.11)
RDW: 14.4 % (ref 11.5–15.5)
WBC: 9.2 10*3/uL (ref 4.0–10.5)

## 2016-11-10 MED ORDER — NALOXONE HCL 0.4 MG/ML IJ SOLN
INTRAMUSCULAR | Status: AC
  Filled 2016-11-10: qty 1

## 2016-11-10 MED ORDER — SODIUM CHLORIDE 0.9 % IV BOLUS (SEPSIS)
500.0000 mL | Freq: Once | INTRAVENOUS | Status: AC
  Administered 2016-11-10: 500 mL via INTRAVENOUS

## 2016-11-10 MED ORDER — NALOXONE HCL 0.4 MG/ML IJ SOLN
0.4000 mg | Freq: Once | INTRAMUSCULAR | Status: AC
  Administered 2016-11-10: 0.4 mg via INTRAVENOUS
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
## 2016-11-10 NOTE — Evaluation (Addendum)
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 PMH includes benzodiazepine withdrawl with delirium, bipolar disorder, anxiety, depression and hx of seizures.  Clinical Impression  Pt is s/p TKA resulting in the deficits listed below (see PT Problem List). Pt limited by confusion and difficulty following commands. Pt mod A for med mobility, transfers to RW and ambulation 2 feet with RW.  Pt will benefit from skilled PT to increase their independence and safety with mobility to allow discharge to the venue listed below.     Follow Up Recommendations SNF    Equipment Recommendations  Rolling walker with 5" wheels;3in1 (PT)    Recommendations for Other Services OT consult     Precautions / Restrictions Precautions Precautions: Knee Precaution Booklet Issued: Yes (comment) Required Braces or Orthoses: Knee Immobilizer - Right Knee Immobilizer - Right: On when out of bed or walking Restrictions Weight Bearing Restrictions: Yes RLE Weight Bearing: Weight bearing as tolerated      Mobility  Bed Mobility Overal bed mobility: Needs Assistance Bed Mobility: Supine to Sit     Supine to sit: Mod assist     General bed mobility comments: pt requires max cuing for hand placement and pulling up to sitting  Transfers Overall transfer level: Needs assistance Equipment used: Standard walker Transfers: Sit to/from Stand Sit to Stand: Mod assist         General transfer comment: difficulty following single step commands for hand placement on RW, correct push off from bed.   Ambulation/Gait Ambulation/Gait assistance: Mod assist   Assistive device: Rolling walker (2 wheeled) Gait Pattern/deviations: Step-to pattern;Decreased step length - right;Decreased step length - left;Shuffle;Antalgic;Leaning  posteriorly;Narrow base of support Gait velocity: decreased   General Gait Details: verbal and tactile cuing for steps to the chair and weightbearing through R knee as needed     Balance Overall balance assessment: Needs assistance Sitting-balance support: Bilateral upper extremity supported;Feet supported Sitting balance-Leahy Scale: Poor Sitting balance - Comments: pt difficulty maintaining upright due to increased posterior lean Postural control: Posterior lean Standing balance support: Bilateral upper extremity supported Standing balance-Leahy Scale: Poor Standing balance comment: requires RW support and mod Assist from PT                             Pertinent Vitals/Pain Pain Location: R knee,  Pain Descriptors / Indicators: Constant;Throbbing  VSS    Home Living Family/patient expects to be discharged to:: Skilled nursing facility                      Prior Function Level of Independence: Independent         Comments: household ambulator only     Hand Dominance        Extremity/Trunk Assessment        Lower Extremity Assessment RLE Deficits / Details: decreased hip and knee ROM, ankle ROM WFL RLE: Unable to fully assess due to pain       Communication   Communication:  (easily confused and difficulty following one step commands)  Cognition Arousal/Alertness: Suspect due to medications;Lethargic Behavior During Therapy:  (confused ) Overall Cognitive Status: Impaired/Different from baseline Area of Impairment: Attention;Safety/judgement;Awareness;Following commands;Orientation  Orientation Level: Situation;Disoriented to Current Attention Level: Selective   Following Commands: Follows one step commands inconsistently Safety/Judgement: Decreased awareness of safety;Decreased awareness of deficits Awareness: Intellectual   General Comments: pt confused about questions regarding her home environment, and which  knee was operated on and sure that she had gone outside with nursing earlier      General Comments      Exercises Total Joint Exercises Ankle Circles/Pumps: AROM;10 reps;Both;Seated Quad Sets: AROM;Right;10 reps;Seated   Assessment/Plan    PT Assessment Patient needs continued PT services  PT Problem List Decreased strength;Decreased range of motion;Decreased activity tolerance;Decreased balance;Decreased mobility;Decreased cognition;Decreased knowledge of use of DME;Decreased safety awareness;Pain       PT Treatment Interventions DME instruction;Gait training;Stair training;Functional mobility training;Therapeutic activities;Therapeutic exercise;Balance training;Cognitive remediation;Patient/family education    PT Goals (Current goals can be found in the Care Plan section)  Acute Rehab PT Goals Patient Stated Goal: to go home PT Goal Formulation: With patient Time For Goal Achievement: 11/17/16 Potential to Achieve Goals: Fair    Frequency 7X/week   Barriers to discharge Decreased caregiver support         End of Session Equipment Utilized During Treatment: Gait belt;Right knee immobilizer Activity Tolerance: Patient limited by lethargy Patient left: in chair;with call bell/phone within reach;with chair alarm set Nurse Communication: Mobility status PT Visit Diagnosis: Unsteadiness on feet (R26.81);Other abnormalities of gait and mobility (R26.89);Pain Pain - Right/Left: Right Pain - part of body: Knee    Time:  -      Charges:         PT G Codes:        Ledon Weihe B. Beverely Risen PT, DPT Acute Rehabilitation  [phone] Pager [phone]    Elon Alas Fleet 11/10/2016, 1:46 PM

## 2016-11-10 NOTE — Significant Event (Signed)
Rapid Response Event Note  Overview: Time Called: 1507 Arrival Time: 1510 Event Type: Hypotension  Initial Focused Assessment: [name] day post op after total knee. She is sleepy and hard to arouse, sitting in the chair BP 84/55  Hr 104  RR 12  O2 sat 95% on RA Lung sounds decreased bases  Interventions: 0.4 Narcan given IV 500cc NS bolus  Brittany more alert and active  Plan of Care (if not transferred): Rn to call if Brittany becomes somnolent again or if she has a decrease LOC.  Or if she is hypotensive.  Event Summary: Name of Physician Notified: Orma Flaming notified by [facility] prior to my arrival at      at    Outcome: Stayed in room and stabalized  Event End Time: 1535  [name]

## 2016-11-10 NOTE — Progress Notes (Signed)
Subjective: 1 Day Post-Op Procedure(s) (LRB): RIGHT TOTAL KNEE ARTHROPLASTY (Right) Patient reports pain as mild. Taking by mouth and voiding okay. No dizziness.   Objective: Vital signs in last 24 hours: Temp:  [97.2 F (36.2 C)-99 F (37.2 C)] 99 F (37.2 C) (04/03 0521) Pulse Rate:  [93-110] 108 (04/03 0521) Resp:  [13-22] 17 (04/03 0521) BP: (120-157)/(82-106) 120/95 (04/03 0521) SpO2:  [89 %-99 %] 93 % (04/03 0521) Weight:  [70.8 kg (156 lb)] 70.8 kg (156 lb) (04/02 1017)  Intake/Output from previous day: 04/02 0701 - 04/03 0700 In: 1848.3 [I.V.:1748.3; IV Piggyback:100] Out: 875 [Urine:800; Blood:75] Intake/Output this shift: No intake/output data recorded.   Recent Labs  11/10/16 0525  HGB 11.0*    Recent Labs  11/10/16 0525  WBC 9.2  RBC 3.63*  HCT 33.3*  PLT 301    Recent Labs  11/10/16 0525  NA 135  K 3.4*  CL 103  CO2 23  BUN 5*  CREATININE 1.01*  GLUCOSE 137*  CALCIUM 8.7*   No results for input(s): LABPT, INR in the last 72 hours. Right knee exam: Dressing clean and dry. Calf soft and nontender. Moves foot actively. Neurovascular intact Sensation intact distally Intact pulses distally Dorsiflexion/Plantar flexion intact Compartment soft  Assessment/Plan: 1 Day Post-Op Procedure(s) (LRB): RIGHT TOTAL KNEE ARTHROPLASTY (Right) Plan: Aspirin 325 mg twice daily for DVT prophylaxis along with SCDs. Encouraged use of bone foam to gain full extension. Up with therapy Hopefully will be ready for discharge to skilled nursing facility tomorrow. If she does really well with physical therapy she could consider going home with home health physical therapy if she is able to get her sister to help her.  Yaseen Gilberg G 11/10/2016, 9:29 AM

## 2016-11-10 NOTE — Progress Notes (Signed)
About 15:00 Patient was hard to arouse and BP was 84/55.  Called Marshia Ly and he ordered a 500cc Bolus and narcan.  Patient's pressure came up to 128/88.  She is alert and stable.  Will continue to monitor.

## 2016-11-10 NOTE — Op Note (Signed)
NAME:  ANAHLA, BEVIS                   ACCOUNT NO.:  MEDICAL RECORD NO.:  [id number]  LOCATION:                                 FACILITY:  PHYSICIAN:  Harvie Junior, M.D.        DATE OF BIRTH:  DATE OF PROCEDURE:  11/09/2016 DATE OF DISCHARGE:                              OPERATIVE REPORT   PREOPERATIVE DIAGNOSIS:  End-stage degenerative joint disease, bilateral knees.  POSTOPERATIVE DIAGNOSIS:  End-stage degenerative joint disease, bilateral knees.  PROCEDURE:  Right total knee replacement with Attune system size 6 narrow femur, size 6 tibia, 6 mm bridging bearing, and a 38-mm all- polyethylene patella.  SURGEON:  Harvie Junior, M.D.  Threasa HeadsOrma Flaming.  ANESTHESIA:  General.  BRIEF HISTORY:  Ms. Wages is a 66 year old female with a long history of significant severe bilateral knee arthritis.  She has significant valgus malalignment and severe bone loss on the lateral side.  We treated conservatively for a period of time and after failure of conservative care, she was taken to the operating room for a right total knee replacement as this was her more painful side.  Her health history and historical issues with concern for infection were such that we felt that she would be in the hospital for at least 2 nights post surgery. She is brought to the operating room for right total knee replacement.  DESCRIPTION OF PROCEDURE:  The patient was brought to the operative room, and after adequate anesthesia was obtained with spinal anesthetic, the patient was placed supine on the operating table.  The right leg was then prepped and draped in usual sterile fashion.  Following this, the leg was exsanguinated.  Blood pressure tourniquet was inflated to 300 mmHg.  Following this, an incision was made following the curve of her valgus malalignment, subcutaneous tissue down to the level of extensor mechanism.  A medial parapatellar arthrotomy was undertaken.  Following this, an  intramedullary pilot hole was drilled and an intramedullary rod was placed and distal femoral cut was made with a 6-degree valgus inclination because of her extreme valgus alignment.  At this point, the femur is sized, it sized to 6, anterior and posterior cuts were made, chamfers and box.  Attention was then turned towards the tibia.  The tibia is cut perpendicular to its long axis and then drilled and keeled. Following this, a bridging bearing was put in place, the knee put into full extension, size 6 femur and size 6 tibia, a 6-mm bridging bearing, excellent full extension is achieved and range of motion achieved. Attention was turned to patella, was cut down to level 13 mm and 38 paddle was chosen.  Lugs were drilled.  The trial poly was placed and patella was placed and knee put through a range of motion.  Excellent stability and range of motion achieved.  Valgus releases of the IT band and lateral capsule had been performed to help get to neutral alignment. Following this, the knee was put through a range of motion.  All trial components were removed.  The knee was copiously and thoroughly irrigated, pulsatile lavage, irrigation, and suctioned dry.  The final components  were cemented in place, size 6 femur, size 6 tibia, a 6-mm bridging bearing, and 38 all poly patella was placed and held with a clamp.  All cement was allowed to harden and all excess bone cement was removed.  The tourniquet was let down.  All bleeding was controlled with electrocautery.  A final poly size 6 was then placed, excellent range of motion and stability were achieved.  At this point, the medial parapatellar arthrotomy was closed with 1 Vicryl running, skin with 0 and 2-0 Vicryl and 3-0 Monocryl subcuticular.  Benzoin and Steri-Strips were applied.  Sterile compressive dressing was applied, and the patient was taken to the recovery room and was noted to be in satisfactory condition.  Estimated blood loss for  the procedure was minimal.     Harvie Junior, M.D.     Ranae Plumber  D:  11/09/2016  T:  11/09/2016  Job:  [id number]  cc:   Harvie Junior, M.D.

## 2016-11-10 NOTE — Clinical Social Work Note (Signed)
Clinical Social Work Assessment  Patient Details  Name: [name] MRN: [id number] Date of Birth: 09/03/1950  Date of referral:  11/10/16               Reason for consult:  Facility Placement                Permission sought to share information with:  Chartered certified accountant granted to share information::  No  Name::        Agency::     Relationship::     Contact Information:     Housing/Transportation Living arrangements for the past 2 months:  Single Family Home Source of Information:  Patient Patient Interpreter Needed:  None Criminal Activity/Legal Involvement Pertinent to Current Situation/Hospitalization:  No - Comment as needed Significant Relationships:  Other(Comment), Adult Children (Ex-Partner support) Lives with:  Self, Pets Do you feel safe going back to the place where you live?  Yes Need for family participation in patient care:  No (Coment)  Care giving concerns:   Patient resides alone with pets and has minimal support.  Patient has an ex-partner and adult children that may provide minimal support.   Social Worker assessment / plan:  CSW met with patient to discuss SNF options post knee surgery. CSW explained that pt would be evaluated by PT and that they may recommend SNF if she would be unsafe to return home with current impairment.  Explained SNF to patient and SNF referral process.  Employment status:  Retired Nurse, adult PT Recommendations:  Not assessed at this time Information / Referral to community resources:  [place]  Patient/Family's Response to care:  Patient willing to discuss skilled nursing options however desires to return home. CSW will discuss further once PT assess and make recommendations for care.  Patient/Family's Understanding of and Emotional Response to Diagnosis, Current Treatment, and Prognosis:  Patient may not fully understand her limitations post surgery and  desires to return home despite lack of support at home.   Emotional Assessment Appearance:  Appears stated age Attitude/Demeanor/Rapport:  Other (cooperative) Affect (typically observed):  Accepting, Flat Orientation:  Oriented to Self, Oriented to Place, Oriented to  Time, Oriented to Situation Alcohol / Substance use:  Not Applicable Psych involvement (Current and /or in the community):  No (Comment)  Discharge Needs  Concerns to be addressed:  Discharge Planning Concerns Readmission within the last 30 days:  No Current discharge risk:  Lives alone, Physical Impairment Barriers to Discharge:  Continued Medical Work up   [employer], [place] 11/10/2016, 11:26 AM

## 2016-11-10 NOTE — Progress Notes (Signed)
qPhysical Therapy Treatment Patient Details Name: [name] MRN: [id number] DOB: 15-Feb-1951 Today's Date: 11/10/2016    History of Present Illness Pt is 66 yo female with R knee pain due to end stage OA, s/p R TKA 11/09/16. PMH includes benzodiazepine withdrawl with delirium, bipolar disorder, anxiety, depression and hx of seizures.    PT Comments    Pt performed increased activity progressing to increased gait and minimal exercise.  Pt remains to present with cognitive deficits which severly limit patient's function.  Pt will continue to benefit from skilled nursing placement for rehab before returning home to private residence.   Plan next session to perform gait and review HEP if patient is able to comprehend commands.   Follow Up Recommendations  SNF     Equipment Recommendations  Rolling walker with 5" wheels;3in1 (PT)    Recommendations for Other Services OT consult     Precautions / Restrictions Precautions Precautions: Knee Precaution Booklet Issued: Yes (comment) Required Braces or Orthoses: Knee Immobilizer - Right Knee Immobilizer - Right: On when out of bed or walking (did not use during tx.  ) Restrictions Weight Bearing Restrictions: Yes RLE Weight Bearing: Weight bearing as tolerated    Mobility  Bed Mobility Overal bed mobility: Needs Assistance Bed Mobility: Sit to Supine       Sit to supine: Supervision   General bed mobility comments: Increased time with use of rail to move toward edge of bed once positioned in supine.    Transfers Overall transfer level: Needs assistance Equipment used: Rolling walker (2 wheeled) Transfers: Sit to/from Stand Sit to Stand: Mod assist         General transfer comment: LOB posterior with poor cognitive abilities to let go of the bedside commode and reach for the RW.  Pt demonstrated better hand placement during stand to sit.    Ambulation/Gait Ambulation/Gait assistance: Mod assist Ambulation Distance  (Feet): 60 Feet Assistive device: Rolling walker (2 wheeled) Gait Pattern/deviations: Step-through pattern;Ataxic;Scissoring;Decreased stride length;Narrow base of support;Trunk flexed Gait velocity: decreased Gait velocity interpretation: Below normal speed for age/gender General Gait Details: Poor placement of body position in RW.  Constant cueing to step toward RW.  cues to increased BOS and improve posture.  Pt lacks knowledge of her deficits and does not understand why her knee is hurting.     Stairs            Wheelchair Mobility    Modified Rankin (Stroke Patients Only)       Balance Overall balance assessment: Needs assistance Sitting-balance support: Bilateral upper extremity supported;Feet supported Sitting balance-Leahy Scale: Fair   Postural control: Posterior lean;Left lateral lean   Standing balance-Leahy Scale: Poor Standing balance comment: requires RW support and mod Assist from PT                            Cognition Arousal/Alertness: Awake/alert Behavior During Therapy: Flat affect Overall Cognitive Status: Impaired/Different from baseline Area of Impairment: Attention;Safety/judgement;Awareness;Following commands;Orientation                 Orientation Level: Situation;Disoriented to Current Attention Level: Selective   Following Commands: Follows one step commands inconsistently Safety/Judgement: Decreased awareness of safety;Decreased awareness of deficits Awareness: Intellectual   General Comments: pt confused about questions regarding her home environment, and which knee was operated on and sure that she had gone outside with nursing earlier      Exercises Total Joint Exercises Ankle  Circles/Pumps: AROM;Both;10 reps Quad Sets:  (unable to follow commands to perform exercise.  ) Heel Slides: AROM;Right;10 reps;Supine Long Arc Quad: AROM;Right;10 reps;Seated    General Comments        Pertinent Vitals/Pain Pain  Assessment: Faces Faces Pain Scale: Hurts little more Pain Location: R knee,  Pain Descriptors / Indicators: Discomfort;Grimacing Pain Intervention(s): Monitored during session;Repositioned    Home Living Family/patient expects to be discharged to:: Unsure Living Arrangements: Alone                  Prior Function            PT Goals (current goals can now be found in the care plan section) Acute Rehab PT Goals Patient Stated Goal: to go home Potential to Achieve Goals: Fair Progress towards PT goals: Progressing toward goals    Frequency    7X/week      PT Plan Current plan remains appropriate    Co-evaluation             End of Session Equipment Utilized During Treatment: Gait belt;Right knee immobilizer Activity Tolerance:  (treatment limited due to cognitive deficits.) Patient left: in bed;with bed alarm set;with call bell/phone within reach (in CPM) Nurse Communication: Mobility status PT Visit Diagnosis: Unsteadiness on feet (R26.81);Other abnormalities of gait and mobility (R26.89);Pain Pain - Right/Left: Right Pain - part of body: Knee     Time: 1543-1601 PT Time Calculation (min) (ACUTE ONLY): 18 min  Charges:  $Gait Training: 8-22 mins                    G Codes:       Joycelyn Rua, PTA pager [phone]    Florestine Avers 11/10/2016, 4:21 PM

## 2016-11-10 NOTE — [place] FL2 (Signed)
[place] MEDICAID FL2 LEVEL OF CARE SCREENING TOOL     IDENTIFICATION  Patient Name: [name] Birthdate: [birth year] Sex: female Admission Date (Current Location): 11/09/2016  [facility] and [place] Number:  Producer, television/film/video and Address:  [facility], [address], [place], [place] [address]      Provider Number: [id number]  Attending Physician Name and Address:  Jodi Geralds, MD  Relative Name and Phone Number:       Current Level of Care: Hospital Recommended Level of Care: Skilled Nursing Facility Prior Approval Number:    Date Approved/Denied:   PASRR Number: [id number]  Discharge Plan: SNF    Current Diagnoses: Patient Active Problem List   Diagnosis Date Noted  . Primary osteoarthritis of right knee 11/09/2016  . 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 Seizure (HCC) 03/06/2014  . Urinary tract infection, site not specified 03/06/2014  . Osteoarthritis of both knees 01/13/2012  . Bipolar 1 disorder (HCC) 01/13/2012  . Hypothyroid 01/13/2012  . Tobacco use 01/13/2012    Orientation RESPIRATION BLADDER Height & Weight     Self, Time, Situation, Place  Normal Continent Weight: 156 lb (70.8 kg) Height:     BEHAVIORAL SYMPTOMS/MOOD NEUROLOGICAL BOWEL NUTRITION STATUS      Continent  (Please see d/c summary)  AMBULATORY STATUS COMMUNICATION OF NEEDS Skin   Extensive Assist Verbally Surgical wounds (Closed incision right knee, compression wrapped)                       Personal Care Assistance Level of Assistance  Bathing, Feeding, Dressing Bathing Assistance: Maximum assistance Feeding assistance: Limited assistance Dressing Assistance: Maximum assistance     Functional Limitations Info  Sight, Hearing, Speech Sight Info: Adequate Hearing Info: Adequate Speech Info: Adequate    SPECIAL CARE FACTORS FREQUENCY  PT (By licensed PT), OT (By licensed OT)     PT Frequency: 7x OT Frequency: 7x            Contractures Contractures Info: Not present    Additional Factors Info  Code Status Code Status Info: Full Code Allergies Info: Cortizone-10 Hydrocortisone, Lamictal Lamotrigine           Current Medications (11/10/2016):  This is the current hospital active medication list Current Facility-Administered Medications  Medication Dose Route Frequency Provider Last Rate Last Dose  . 0.9 %  sodium chloride infusion   Intravenous Continuous Marshia Ly, PA-C 100 mL/hr at 11/10/16 0528 100 mL/hr at 11/10/16 0528  . acetaminophen (TYLENOL) tablet 650 mg  650 mg Oral Q6H PRN Marshia Ly, PA-C       Or  . acetaminophen (TYLENOL) suppository 650 mg  650 mg Rectal Q6H PRN Marshia Ly, PA-C      . albuterol (PROVENTIL) (2.5 MG/3ML) 0.083% nebulizer solution 2.5 mg  2.5 mg Nebulization Q4H PRN Allena Katz, RPH      . alum & mag hydroxide-simeth (MAALOX/MYLANTA) 200-200-20 MG/5ML suspension 30 mL  30 mL Oral Q4H PRN Marshia Ly, PA-C      . aspirin EC tablet 325 mg  325 mg Oral BID PC Marshia Ly, PA-C   325 mg at 11/10/16 0801  . atorvastatin (LIPITOR) tablet 10 mg  10 mg Oral QHS Marshia Ly, PA-C   10 mg at 11/09/16 2112  . bisacodyl (DULCOLAX) EC tablet 5 mg  5 mg Oral Daily PRN Marshia Ly, PA-C      . celecoxib (CELEBREX) capsule 200 mg  200 mg Oral Q12H Marshia Ly, PA-C   200 mg at 11/10/16 0800  . clonazePAM (KLONOPIN) tablet 0.5 mg  0.5 mg Oral TID Marshia Ly, PA-C   0.5 mg at 11/10/16 0801  . dexamethasone (DECADRON) injection 10 mg  10 mg Intravenous Q12H Marshia Ly, PA-C   10 mg at 11/10/16 0802  . diphenhydrAMINE (BENADRYL) 12.5 MG/5ML elixir 12.5-25 mg  12.5-25 mg Oral Q4H PRN Marshia Ly, PA-C      . docusate sodium (COLACE) capsule 100 mg  100 mg Oral BID Marshia Ly, PA-C   100 mg at 11/10/16 0801  . gabapentin (NEURONTIN) capsule 300 mg  300 mg Oral BID Marshia Ly, PA-C   300 mg at 11/10/16 0800  . HYDROmorphone (DILAUDID) injection  0.5-1 mg  0.5-1 mg Intravenous Q3H PRN Marshia Ly, PA-C      . levothyroxine (SYNTHROID, LEVOTHROID) tablet 88 mcg  88 mcg Oral QAC breakfast Marshia Ly, PA-C   88 mcg at 11/10/16 0802  . magnesium citrate solution 1 Bottle  1 Bottle Oral Once PRN Marshia Ly, PA-C      . methocarbamol (ROBAXIN) tablet 500 mg  500 mg Oral Q6H PRN Marshia Ly, PA-C       Or  . methocarbamol (ROBAXIN) 500 mg in dextrose 5 % 50 mL IVPB  500 mg Intravenous Q6H PRN Marshia Ly, PA-C      . ondansetron [facility]) tablet 4 mg  4 mg Oral Q6H PRN Marshia Ly, PA-C       Or  . ondansetron [facility]) injection 4 mg  4 mg Intravenous Q6H PRN Marshia Ly, PA-C      . oxyCODONE (Oxy IR/ROXICODONE) immediate release tablet 5-10 mg  5-10 mg Oral Q3H PRN Marshia Ly, PA-C   10 mg at 11/10/16 1051  . pantoprazole (PROTONIX) EC tablet 80 mg  80 mg Oral Daily Marshia Ly, PA-C   80 mg at 11/10/16 0801  . PARoxetine (PAXIL) tablet 40 mg  40 mg Oral Daily Marshia Ly, PA-C   40 mg at 11/10/16 0802  . polyethylene glycol (MIRALAX / GLYCOLAX) packet 17 g  17 g Oral Daily PRN Marshia Ly, PA-C      . promethazine (PHENERGAN) injection 12.5 mg  12.5 mg Intravenous Q6H PRN Marshia Ly, PA-C      . QUEtiapine (SEROQUEL) tablet 800 mg  800 mg Oral QHS Marshia Ly, PA-C   800 mg at 11/09/16 2112     Discharge Medications: Please see discharge summary for a list of discharge medications.  Relevant Imaging Results:  Relevant Lab Results:   Additional Information SSN: [id number]  Maree Krabbe, LCSW

## 2016-11-11 DIAGNOSIS — R339 Retention of urine, unspecified: Secondary | ICD-10-CM

## 2016-11-11 LAB — CBC
HCT: 25.1 % — ABNORMAL LOW (ref 36.0–46.0)
HEMOGLOBIN: 8.3 g/dL — AB (ref 12.0–15.0)
MCH: 30 pg (ref 26.0–34.0)
MCHC: 33.1 g/dL (ref 30.0–36.0)
MCV: 90.6 fL (ref 78.0–100.0)
Platelets: 250 10*3/uL (ref 150–400)
RBC: 2.77 MIL/uL — ABNORMAL LOW (ref 3.87–5.11)
RDW: 14.1 % (ref 11.5–15.5)
WBC: 9.3 10*3/uL (ref 4.0–10.5)

## 2016-11-11 LAB — URINALYSIS, ROUTINE W REFLEX MICROSCOPIC
BILIRUBIN URINE: NEGATIVE
Glucose, UA: 50 mg/dL — AB
HGB URINE DIPSTICK: NEGATIVE
Ketones, ur: NEGATIVE mg/dL
Leukocytes, UA: NEGATIVE
Nitrite: NEGATIVE
Protein, ur: NEGATIVE mg/dL
SPECIFIC GRAVITY, URINE: 1.009 (ref 1.005–1.030)
pH: 5 (ref 5.0–8.0)

## 2016-11-11 NOTE — Progress Notes (Addendum)
Subjective: 2 Days Post-Op Procedure(s) (LRB): RIGHT TOTAL KNEE ARTHROPLASTY (Right) Patient reports pain as moderate. The patient was unable to void last night and was in and out cathed with 1000 mL obtained. She now is unable to void again. Her bladder scan shows 1000 mL. She is progressing with physical therapy,but they feel that she needs skilled nursing facility. Denies dizziness or shortness of breath.   Objective: Vital signs in last 24 hours: Temp:  [97.7 F (36.5 C)-98.8 F (37.1 C)] 97.7 F (36.5 C) (04/04 0503) Pulse Rate:  [100-107] 105 (04/04 0503) Resp:  [9-16] 16 (04/04 0503) BP: (84-136)/(55-88) 115/65 (04/04 0503) SpO2:  [91 %-95 %] 95 % (04/04 0503)  Intake/Output from previous day: 04/03 0701 - 04/04 0700 In: 1997 [P.O.:592; I.V.:905; IV Piggyback:500] Out: 1320 [Urine:1320] Intake/Output this shift: Total I/O In: 240 [P.O.:240] Out: -    Recent Labs  11/10/16 0525 11/11/16 0425  HGB 11.0* 8.3*    Recent Labs  11/10/16 0525 11/11/16 0425  WBC 9.2 9.3  RBC 3.63* 2.77*  HCT 33.3* 25.1*  PLT 301 250    Recent Labs  11/10/16 0525  NA 135  K 3.4*  CL 103  CO2 23  BUN 5*  CREATININE 1.01*  GLUCOSE 137*  CALCIUM 8.7*   No results for input(s): LABPT, INR in the last 72 hours. Right knee exam: Dressing clean and dry. Calf is soft. She has excellent plantar and dorsiflexor strength. Normal sensation distally. Distal pulses 2+.   Assessment/Plan: 2 Days Post-Op Procedure(s) (LRB): RIGHT TOTAL KNEE ARTHROPLASTY (Right) Postop urinary retention. Acute blood loss anemia. Expected. Plan: I will have her nurse place a Foley catheter. She will obtain a urinalysis and urine culture and sensitivities. She can be discharged to skilled nursing facility today. She will need follow-up with urology in 1 week. Follow-up with Dr. Luiz Blare in 2 weeks. Aspirin 325 mg twice daily for DVT prophylaxis 1 month postop. Weight-bear as tolerated on right. Will  need daily physical therapy. Will start her on oral iron on discharge for anemia.  Dareion Kneece G 11/11/2016, 2:36 PM

## 2016-11-11 NOTE — Clinical Social Work Note (Signed)
RN to call report to [facility] [phone].   [place], [place] [phone]

## 2016-11-11 NOTE — Clinical Social Work Note (Signed)
Clinical Social Worker facilitated patient discharge including contacting patient family and facility to confirm patient discharge plans.  Clinical information faxed to facility and family agreeable with plan.  CSW arranged ambulance transport via [facility] (4:00) to [facility].  RN to call [phone]  for report prior to discharge.  Clinical Social Worker will sign off for now as social work intervention is no longer needed. Please consult [place] again if new need arises.  [address] [place] [phone]

## 2016-11-11 NOTE — Discharge Summary (Addendum)
Patient ID: [name] MRN: [id number] DOB/AGE: 1951/06/27 66 y.o.  Admit date: 11/09/2016 Discharge date: 11/11/2016  Admission Diagnoses:  Principal Problem:   Primary osteoarthritis of right knee Active Problems:   Urinary retention   Postoperative anemia due to acute blood loss   Discharge Diagnoses:  Same  Past Medical History:  Diagnosis Date  . Anemia   . Anxiety   . Asthma   . 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 Bipolar disorder (HCC)   . Carotid stenosis, bilateral     " mild 39%"  . Depression   . Family history of adverse reaction to anesthesia    sister had PONV  . Full dentures   . GERD (gastroesophageal reflux disease)   . H/O hiatal hernia   . Headache   . Hypothyroidism   . Inflammatory polyps of colon (HCC)    pt notes flat and is to have surgery to remove   . OA (ocular albinism) (HCC)   . Osteoarthritis of both knees   . Pneumonia   . Seizure (HCC) 03/06/2014   tonic clonic seizure which lasted about a minute    Surgeries: Procedure(s): RIGHT TOTAL KNEE ARTHROPLASTY on 11/09/2016    Discharged Condition: Improved  Hospital Course: [name] is an 66 y.o. female who was admitted 11/09/2016 for operative treatment ofPrimary osteoarthritis of right knee. Patient has severe unremitting pain that affects sleep, daily activities, and work/hobbies. After pre-op clearance the patient was taken to the operating room on 11/09/2016 and underwent  Procedure(s): RIGHT TOTAL KNEE ARTHROPLASTY.    Patient was given perioperative antibiotics:  Anti-infectives    Start     Dose/Rate Route Frequency Ordered Stop   11/09/16 1845  ceFAZolin (ANCEF) IVPB 2g/100 mL premix     2 g 200 mL/hr over 30 Minutes Intravenous Every 6 hours 11/09/16 1836 11/10/16 0644   11/09/16 1023  ceFAZolin (ANCEF) 2-4 GM/100ML-% IVPB    Comments:  Forte, Lindsi   : cabinet override      11/09/16 1023 11/09/16 1305   11/09/16 1009  ceFAZolin (ANCEF) IVPB  2g/100 mL premix     2 g 200 mL/hr over 30 Minutes Intravenous On call to O.R. 11/09/16 1009 11/09/16 1335       Patient was given sequential compression devices, early ambulation, and chemoprophylaxis to prevent DVT. On the evening of postoperative day #1 she was unable to void and had in and out catheter showing 1000 mL of urine. She then on the date of discharge was unable to void again and a Foley catheter was placed and a new urinalysis and urine culture and sensitivities were obtained. She'll be discharged to skilled nursing facility. She was progressing with physical therapy, but did need skilled care. She will need follow-up with urology in 1 week. We will check her urinalysis and culture results.  Patient benefited maximally from hospital stay and there were no complications.    Recent vital signs:  No data found.    Recent laboratory studies:   Recent Labs  11/10/16 0525 11/11/16 0425  WBC 9.2 9.3  HGB 11.0* 8.3*  HCT 33.3* 25.1*  PLT 301 250  NA 135  --   K 3.4*  --   CL 103  --   CO2 23  --   BUN 5*  --   CREATININE 1.01*  --   GLUCOSE 137*  --   CALCIUM 8.7*  --      Discharge Medications:  Allergies as of 11/11/2016      Reactions   Cortizone-10 [hydrocortisone] Rash   Lamictal [lamotrigine] Rash      Medication List    STOP taking these medications   traMADol 50 MG tablet Commonly known as:  ULTRAM     TAKE these medications   albuterol 90 MCG/ACT inhaler Commonly known as:  PROVENTIL,VENTOLIN Inhale 2 puffs into the lungs every 4 (four) hours as needed.   aspirin EC 325 MG tablet Take 1 tablet (325 mg total) by mouth 2 (two) times daily after a meal. Take x 1 month post op to decrease risk of blood clots.   atorvastatin 10 MG tablet Commonly known as:  LIPITOR Take 10 mg by mouth at bedtime.   cetirizine 10 MG tablet Commonly known as:  ZYRTEC Take 10 mg by mouth daily as needed for allergies.   clonazePAM 0.5 MG tablet Commonly known  as:  KLONOPIN Take 1 tablet (0.5 mg total) by mouth 2 (two) times daily. What changed:  when to take this   docusate sodium 100 MG capsule Commonly known as:  COLACE Take 1 capsule (100 mg total) by mouth 2 (two) times daily.   hydrocortisone 2.5 % lotion Apply topically 2 (two) times daily as needed. For itching   levothyroxine 88 MCG tablet Commonly known as:  SYNTHROID, LEVOTHROID take TABLET BY MOUTH DAILY ON EMPTY STOMACH   omeprazole 40 MG capsule Commonly known as:  PRILOSEC Take 40 mg by mouth daily.   oxyCODONE-acetaminophen 5-325 MG tablet Commonly known as:  PERCOCET/ROXICET Take 1-2 tablets by mouth every 6 (six) hours as needed for severe pain.   PARoxetine 40 MG tablet Commonly known as:  PAXIL Take 1 tablet (40 mg total) by mouth daily.   QUEtiapine 400 MG tablet Commonly known as:  SEROQUEL Take 800 mg by mouth at bedtime.   tiZANidine 2 MG tablet Commonly known as:  ZANAFLEX Take 1 tablet (2 mg total) by mouth every 8 (eight) hours as needed for muscle spasms.       Diagnostic Studies: Dg Chest 2 View  Result Date: 11/09/2016 CLINICAL DATA:  Preoperative right total knee replacement. History of asthma. EXAM: CHEST  2 VIEW COMPARISON:  None. FINDINGS: There is no edema or consolidation. Heart size and pulmonary vascularity are normal. No adenopathy. There is a moderate hiatal hernia. No bone lesions. IMPRESSION: Moderate hiatal hernia.  No edema or consolidation. Electronically Signed   By: Bretta Bang III M.D.   On: 11/09/2016 10:57    Disposition: Skilled nursing facility  Discharge Instructions    CPM    Complete by:  As directed    Continuous passive motion machine (CPM):      Use the CPM from 0 to 60 for 8 hours per day.      You may increase by 5-10 per day.  You may break it up into 2 or 3 sessions per day.      Use CPM for 1-2 weeks or until you are told to stop.   Call MD / Call 911    Complete by:  As directed    If you  experience chest pain or shortness of breath, CALL 911 and be transported to the hospital emergency room.  If you develope a fever above 101 F, pus (white drainage) or increased drainage or redness at the wound, or calf pain, call your surgeon's office.   Constipation Prevention    Complete by:  As directed  Drink plenty of fluids.  Prune juice may be helpful.  You may use a stool softener, such as Colace (over the counter) 100 mg twice a day.  Use MiraLax (over the counter) for constipation as needed.   Diet general    Complete by:  As directed    Do not put a pillow under the knee. Place it under the heel.    Complete by:  As directed    Use bone foam.   Increase activity slowly as tolerated    Complete by:  As directed    Weight bearing as tolerated    Complete by:  As directed    Laterality:  right   Extremity:  Lower   Weight bearing as tolerated    Complete by:  As directed    Laterality:  right   Extremity:  Lower       Contact information for follow-up providers    GRAVES,JOHN L, MD. Schedule an appointment as soon as possible for a visit in 2 weeks.   Specialty:  Orthopedic Surgery Contact information: [address] [place] [address] [phone]        Garnett Farm, MD. Schedule an appointment as soon as possible for a visit in 7 days.   Specialty:  Urology Why:  Has foley catheter. Will need voiding trail and urologic management. She has been a patient there before. Contact information: [address] [place] [address] [phone]            Contact information for after-discharge care    Destination    [facility] .   Specialty:  Skilled Nursing Facility Contact information: [address] [place] [address] [phone]                 I will call [facility] in [place] to start FeSo4  1 daily x 3 weeks for her anemia.  SignedMatthew Folks 11/12/2016, 8:44 AM

## 2016-11-11 NOTE — Progress Notes (Signed)
Patient fell in floor. She was found sitting on the floor and stated that her feet slipped out from under her.  She denied having any pain and denied hitting her head or any other part of her body.  Neuro intact.  Patient helped up and back to bed. Gus Puma notified and will see patient in the morning.

## 2016-11-11 NOTE — Progress Notes (Signed)
qPhysical Therapy Treatment Patient Details Name: [name] MRN: [id number] DOB: 1951/07/21 Today's Date: 11/11/2016    History of Present Illness Pt is 66 yo female with R knee pain due to end stage OA, s/p R TKA 11/09/16. PMH includes benzodiazepine withdrawl with delirium, bipolar disorder, anxiety, depression and hx of seizures.    PT Comments    Patient is making progress with PT.  Pt continues to be confused about where she is, why her knee hurts and how she will be getting home. Pt with difficulty following simple one step commands. Pt min A with bed mobility, and modA for transfers and ambulation of 100 feet with RW. Pt requires continued skilled PT to progress transfers and ambulation and to improve LE ROM and strength to be progress at her next care facility.      Follow Up Recommendations  SNF     Equipment Recommendations  Rolling walker with 5" wheels;3in1 (PT)    Recommendations for Other Services OT consult     Precautions / Restrictions Precautions Precautions: Knee Precaution Booklet Issued: Yes (comment) Required Braces or Orthoses: Knee Immobilizer - Right Knee Immobilizer - Right: On when out of bed or walking Restrictions Weight Bearing Restrictions: Yes RLE Weight Bearing: Weight bearing as tolerated    Mobility  Bed Mobility Overal bed mobility: Needs Assistance Bed Mobility: Supine to Sit     Supine to sit: Min assist     General bed mobility comments: MinA to pull hips forward to get LE on floor  Transfers Overall transfer level: Needs assistance Equipment used: Rolling walker (2 wheeled) Transfers: Sit to/from Stand Sit to Stand: Mod assist         General transfer comment: posterior lean in coming to upright required max verbal and tactile cues to reach up to walker and bring hips under her   Ambulation/Gait Ambulation/Gait assistance: Mod assist Ambulation Distance (Feet): 100 Feet Assistive device: Rolling walker (2  wheeled) Gait Pattern/deviations: Step-to pattern;Decreased step length - right;Decreased step length - left;Shuffle;Antalgic;Leaning posteriorly;Narrow base of support;Trunk flexed Gait velocity: decreased Gait velocity interpretation: Below normal speed for age/gender General Gait Details: verbal and tactile cues to stay inside the walker instead of pushing it our in front of her and also to keep her hands on the handles      Balance Overall balance assessment: Needs assistance Sitting-balance support: Bilateral upper extremity supported;Feet supported Sitting balance-Leahy Scale: Poor Sitting balance - Comments: able to stay upright but maintains a posterior lean even with cuing for coming forward Postural control: Posterior lean Standing balance support: Bilateral upper extremity supported Standing balance-Leahy Scale: Poor Standing balance comment: requires RW support and mod Assist from PT                            Cognition Arousal/Alertness: Suspect due to medications;Lethargic Behavior During Therapy:  (confused ) Overall Cognitive Status: Impaired/Different from baseline Area of Impairment: Attention;Safety/judgement;Awareness;Following commands;Orientation                 Orientation Level: Situation;Disoriented to Current Attention Level: Selective   Following Commands: Follows one step commands inconsistently Safety/Judgement: Decreased awareness of safety;Decreased awareness of deficits Awareness: Intellectual   General Comments: pt confused about questions regarding her home environment, and which knee was operated on and sure that she had gone outside with nursing earlier      Exercises Total Joint Exercises Ankle Circles/Pumps: AROM;10 reps;Both;Supine Quad Sets: AROM;Right;10 reps;Supine Heel  Slides: AROM;Right;10 reps;Supine    General Comments        Pertinent Vitals/Pain Pain Assessment: Faces Faces Pain Scale: Hurts little  more Pain Location: R knee,  Pain Descriptors / Indicators: Constant;Throbbing Pain Intervention(s): Limited activity within patient's tolerance;Monitored during session  VSS           PT Goals (current goals can now be found in the care plan section) Acute Rehab PT Goals Patient Stated Goal: to go home PT Goal Formulation: With patient Time For Goal Achievement: 11/17/16 Potential to Achieve Goals: Fair Progress towards PT goals: Progressing toward goals    Frequency    7X/week      PT Plan Current plan remains appropriate       End of Session Equipment Utilized During Treatment: Gait belt;Right knee immobilizer Activity Tolerance: Patient limited by lethargy Patient left: in chair;with call bell/phone within reach;with chair alarm set Nurse Communication: Mobility status PT Visit Diagnosis: Unsteadiness on feet (R26.81);Other abnormalities of gait and mobility (R26.89);Pain Pain - Right/Left: Right Pain - part of body: Knee     Time: 1610-9604 PT Time Calculation (min) (ACUTE ONLY): 30 min  Charges:  $Gait Training: 8-22 mins $Therapeutic Exercise: 8-22 mins                    G Codes:       [name] B. Beverely Risen PT, DPT Acute Rehabilitation  [phone] Pager [phone]    [name] Fleet 11/11/2016, 9:19 AM

## 2016-11-11 NOTE — Clinical Social Work Placement (Signed)
   CLINICAL SOCIAL WORK PLACEMENT  NOTE  Date:  11/11/2016  Patient Details  Name: [name] MRN: [id number] Date of Birth: May 19, 1951  Clinical Social Work is seeking post-discharge placement for this patient at the Skilled  Nursing Facility level of care (*CSW will initial, date and re-position this form in  chart as items are completed):      Patient/family provided with [facility] Clinical Social Work Department's list of facilities offering this level of care within the geographic area requested by the patient (or if unable, by the patient's family).  Yes   Patient/family informed of their freedom to choose among providers that offer the needed level of care, that participate in Medicare, Medicaid or managed care program needed by the patient, have an available bed and are willing to accept the patient.      Patient/family informed of [facility]'s ownership interest in [facility] and [facility], as well as of the fact that they are under no obligation to receive care at these facilities.  PASRR submitted to EDS on       PASRR number received on 11/11/16     Existing PASRR number confirmed on       FL2 transmitted to all facilities in geographic area requested by pt/family on 11/11/16     FL2 transmitted to all facilities within larger geographic area on       Patient informed that his/her managed care company has contracts with or will negotiate with certain facilities, including the following:        Yes   Patient/family informed of bed offers received.  Patient chooses bed at [facility]     Physician recommends and patient chooses bed at      Patient to be transferred to [facility] on 11/11/16.  Patient to be transferred to facility by [facility]     Patient family notified on 11/11/16 of transfer.  Name of family member notified:  Anette     PHYSICIAN       Additional Comment:     _______________________________________________ Maree Krabbe, LCSW 11/11/2016, 2:58 PM

## 2016-11-12 DIAGNOSIS — D62 Acute posthemorrhagic anemia: Secondary | ICD-10-CM

## 2016-11-12 LAB — URINE CULTURE: CULTURE: NO GROWTH

## 2017-11-12 ENCOUNTER — Other Ambulatory Visit: Payer: Self-pay

## 2017-11-12 ENCOUNTER — Encounter (HOSPITAL_COMMUNITY): Payer: Self-pay

## 2017-11-12 ENCOUNTER — Emergency Department (HOSPITAL_COMMUNITY)
Admission: EM | Admit: 2017-11-12 | Discharge: 2017-11-12 | Disposition: A | Discharge status: Home / Self Care | Payer: Medicare Other | Attending: Emergency Medicine | Admitting: Emergency Medicine

## 2017-11-12 DIAGNOSIS — Z7982 Long term (current) use of aspirin: Secondary | ICD-10-CM | POA: Insufficient documentation

## 2017-11-12 DIAGNOSIS — F17210 Nicotine dependence, cigarettes, uncomplicated: Secondary | ICD-10-CM | POA: Insufficient documentation

## 2017-11-12 DIAGNOSIS — Z96651 Presence of right artificial knee joint: Secondary | ICD-10-CM | POA: Diagnosis not present

## 2017-11-12 DIAGNOSIS — J45909 Unspecified asthma, uncomplicated: Secondary | ICD-10-CM | POA: Insufficient documentation

## 2017-11-12 DIAGNOSIS — F410 Panic disorder [episodic paroxysmal anxiety] without agoraphobia: Secondary | ICD-10-CM | POA: Diagnosis present

## 2017-11-12 DIAGNOSIS — F419 Anxiety disorder, unspecified: Secondary | ICD-10-CM

## 2017-11-12 DIAGNOSIS — E039 Hypothyroidism, unspecified: Secondary | ICD-10-CM | POA: Diagnosis not present

## 2017-11-12 DIAGNOSIS — Z79899 Other long term (current) drug therapy: Secondary | ICD-10-CM | POA: Insufficient documentation

## 2017-11-12 LAB — URINALYSIS, ROUTINE W REFLEX MICROSCOPIC
Bacteria, UA: NONE SEEN
Bilirubin Urine: NEGATIVE
GLUCOSE, UA: NEGATIVE mg/dL
Ketones, ur: NEGATIVE mg/dL
Leukocytes, UA: NEGATIVE
Nitrite: NEGATIVE
PH: 6 (ref 5.0–8.0)
PROTEIN: NEGATIVE mg/dL
Specific Gravity, Urine: 1.004 — ABNORMAL LOW (ref 1.005–1.030)

## 2017-11-12 LAB — CBC
HCT: 44.6 % (ref 36.0–46.0)
HEMOGLOBIN: 15.6 g/dL — AB (ref 12.0–15.0)
MCH: 34.5 pg — ABNORMAL HIGH (ref 26.0–34.0)
MCHC: 35 g/dL (ref 30.0–36.0)
MCV: 98.7 fL (ref 78.0–100.0)
PLATELETS: 234 10*3/uL (ref 150–400)
RBC: 4.52 MIL/uL (ref 3.87–5.11)
RDW: 14.1 % (ref 11.5–15.5)
WBC: 8.9 10*3/uL (ref 4.0–10.5)

## 2017-11-12 LAB — BASIC METABOLIC PANEL
ANION GAP: 11 (ref 5–15)
BUN: 7 mg/dL (ref 6–20)
CALCIUM: 9.3 mg/dL (ref 8.9–10.3)
CO2: 22 mmol/L (ref 22–32)
Chloride: 105 mmol/L (ref 101–111)
Creatinine, Ser: 0.98 mg/dL (ref 0.44–1.00)
GFR, EST NON AFRICAN AMERICAN: 59 mL/min — AB (ref >60)
GLUCOSE: 124 mg/dL — AB (ref 65–99)
Potassium: 3.6 mmol/L (ref 3.5–5.1)
Sodium: 138 mmol/L (ref 135–145)

## 2017-11-12 LAB — I-STAT TROPONIN, ED: Troponin i, poc: 0.02 ng/mL (ref 0.00–0.08)

## 2017-11-12 MED ORDER — CLONAZEPAM 0.5 MG PO TABS: 0.5000 mg | ORAL_TABLET | Freq: Two times a day (BID) | ORAL | 0 refills | Status: AC | PRN

## 2017-11-12 MED ORDER — SODIUM CHLORIDE 0.9 % IV BOLUS
1000.0000 mL | Freq: Once | INTRAVENOUS | Status: AC
  Administered 2017-11-12: 1000 mL via INTRAVENOUS

## 2017-11-12 MED ORDER — CLONAZEPAM 0.5 MG PO TABS
0.5000 mg | ORAL_TABLET | Freq: Once | ORAL | Status: AC
  Administered 2017-11-12: 0.5 mg via ORAL
  Filled 2017-11-12: qty 1

## 2017-11-12 MED ORDER — LORAZEPAM 2 MG/ML IJ SOLN
0.5000 mg | Freq: Once | INTRAMUSCULAR | Status: AC
  Administered 2017-11-12: 0.5 mg via INTRAVENOUS
  Filled 2017-11-12: qty 1

## 2017-11-12 NOTE — ED Notes (Signed)
Bed: WA12 Expected date:  Expected time:  Means of arrival:  Comments: TR1 

## 2017-11-12 NOTE — ED Provider Notes (Signed)
[facility]-EMERGENCY DEPT Provider Note   CSN: [id number] Arrival date & time: 11/12/17  1021     History   Chief Complaint Chief Complaint  Patient presents with  . Loss of Consciousness  . Panic Attack    HPI [name] is a 67 y.o. female.  HPI [name] is a 67 y.o. female with history of asthma, anemia, anxiety, benzodiazepine withdrawal delirium, bipolar disorder, presents to emergency department complaining of anxiety.  Patient states she was having a panic attack and called EMS.  According to EMS, patient hyperventilating and had a brief syncopal episode.  Patient states that she is not having shortness of breath, she is not having any chest pain, no dizziness, she states "I just feel very anxious."  She admits to running out of her Klonopin early.  Her refill is not due for another 5 days.  She states she has been taking it 3 times a day instead of twice a day as prescribed.   Past Medical History:  Diagnosis Date  . Anemia   . Anxiety   . Asthma   . 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 Bipolar disorder (HCC)   . Carotid stenosis, bilateral     " mild 39%"  . Depression   . Family history of adverse reaction to anesthesia    sister had PONV  . Full dentures   . GERD (gastroesophageal reflux disease)   . H/O hiatal hernia   . Headache   . Hypothyroidism   . Inflammatory polyps of colon (HCC)    pt notes flat and is to have surgery to remove   . OA (ocular albinism) (HCC)   . Osteoarthritis of both knees   . Pneumonia   . Seizure (HCC) 03/06/2014   tonic clonic seizure which lasted about a minute    Patient Active Problem List   Diagnosis Date Noted  . Postoperative anemia due to acute blood loss 11/12/2016  . Urinary retention 11/11/2016  . Primary osteoarthritis of right knee 11/09/2016  . 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 Seizure (HCC) 03/06/2014  . Urinary tract  infection, site not specified 03/06/2014  . Osteoarthritis of both knees 01/13/2012  . Bipolar 1 disorder (HCC) 01/13/2012  . Hypothyroid 01/13/2012  . Tobacco use 01/13/2012    Past Surgical History:  Procedure Laterality Date  . CATARACT EXTRACTION W/ INTRAOCULAR LENS  IMPLANT, BILATERAL Bilateral   . MULTIPLE TOOTH EXTRACTIONS    . ROTATOR CUFF REPAIR Right   . TOTAL KNEE ARTHROPLASTY Right 11/09/2016   Procedure: RIGHT TOTAL KNEE ARTHROPLASTY;  Surgeon: Jodi GeraldsJohn Graves, MD;  Location: [facility] OR;  Service: Orthopedics;  Laterality: Right;  . TUBAL LIGATION    . VAGINAL HYSTERECTOMY  01/2003   lap assisted w/BSO/notes 12/23/2010     OB History   None      Home Medications    Prior to Admission medications   Medication Sig Start Date End Date Taking? Authorizing Provider  albuterol (PROVENTIL,VENTOLIN) 90 MCG/ACT inhaler Inhale 2 puffs into the lungs every 4 (four) hours as needed. 08/12/15 10/30/17  Rhetta MuraSamtani, Jai-Gurmukh, MD  aspirin EC 325 MG tablet Take 1 tablet (325 mg total) by mouth 2 (two) times daily after a meal. Take x 1 month post op to decrease risk of blood clots. 11/09/16   Marshia LyBethune, James, PA-C  atorvastatin (LIPITOR) 10 MG tablet Take 10 mg by mouth at bedtime. 01/29/16   [provider]  cetirizine Harless Nakayama(ZYRTEC)  10 MG tablet Take 10 mg by mouth daily as needed for allergies.    [provider]  clonazePAM (KLONOPIN) 0.5 MG tablet Take 1 tablet (0.5 mg total) by mouth 2 (two) times daily. Patient taking differently: Take 0.5 mg by mouth 3 (three) times daily.  06/19/15   Arfeen, Phillips Grout, MD  docusate sodium (COLACE) 100 MG capsule Take 1 capsule (100 mg total) by mouth 2 (two) times daily. 11/09/16   Marshia Ly, PA-C  hydrocortisone 2.5 % lotion Apply topically 2 (two) times daily as needed. For itching 11/26/14   Charm Rings, MD  levothyroxine (SYNTHROID, LEVOTHROID) 88 MCG tablet take TABLET BY MOUTH DAILY ON EMPTY STOMACH 06/13/15   [provider]    omeprazole (PRILOSEC) 40 MG capsule Take 40 mg by mouth daily. 04/12/16   [provider]  oxyCODONE-acetaminophen (PERCOCET/ROXICET) 5-325 MG tablet Take 1-2 tablets by mouth every 6 (six) hours as needed for severe pain. 11/09/16   Marshia Ly, PA-C  PARoxetine (PAXIL) 40 MG tablet Take 1 tablet (40 mg total) by mouth daily. 09/02/15   Arfeen, Phillips Grout, MD  QUEtiapine (SEROQUEL) 400 MG tablet Take 800 mg by mouth at bedtime. 03/25/16   [provider]  tiZANidine (ZANAFLEX) 2 MG tablet Take 1 tablet (2 mg total) by mouth every 8 (eight) hours as needed for muscle spasms. 11/09/16   Marshia Ly, PA-C    Family History Family History  Problem Relation Age of Onset  . Cancer Mother   . Cancer Brother   . Cancer Brother     Social History Social History   Tobacco Use  . Smoking status: Current Every Day Smoker    Packs/day: 0.50    Years: 44.00    Pack years: 22.00    Types: Cigarettes  . Smokeless tobacco: Never Used  Substance Use Topics  . Alcohol use: No    Alcohol/week: 0.0 oz  . Drug use: No     Allergies   Cortizone-10 [hydrocortisone] and Lamictal [lamotrigine]   Review of Systems Review of Systems  Constitutional: Negative for chills and fever.  Respiratory: Negative for cough, chest tightness and shortness of breath.   Cardiovascular: Negative for chest pain, palpitations and leg swelling.  Gastrointestinal: Negative for abdominal pain, diarrhea, nausea and vomiting.  Genitourinary: Negative for dysuria, flank pain, pelvic pain, vaginal bleeding, vaginal discharge and vaginal pain.  Musculoskeletal: Negative for arthralgias, myalgias, neck pain and neck stiffness.  Skin: Negative for rash.  Neurological: Negative for dizziness, weakness and headaches.  Psychiatric/Behavioral: Negative for self-injury and suicidal ideas. The patient is nervous/anxious.   All other systems reviewed and are negative.    Physical Exam Updated Vital Signs BP (!)  120/91   Pulse (!) 108   Temp 98.7 F (37.1 C) (Oral)   Resp 12   Ht 5\' 6"  (1.676 m)   Wt 68.9 kg (152 lb)   SpO2 95%   BMI 24.53 kg/m   Physical Exam  Constitutional: She appears well-developed and well-nourished. No distress.  HENT:  Head: Normocephalic.  Eyes: Conjunctivae are normal.  Neck: Neck supple.  Cardiovascular: Normal rate, regular rhythm and normal heart sounds.  Pulmonary/Chest: Effort normal and breath sounds normal. No respiratory distress. She has no wheezes. She has no rales.  Abdominal: Soft. Bowel sounds are normal. She exhibits no distension. There is no tenderness. There is no rebound.  Musculoskeletal: She exhibits no edema.  Neurological: She is alert.  Skin: Skin is warm and  dry.  Psychiatric:  Appears anxious  Nursing note and vitals reviewed.    ED Treatments / Results  Labs (all labs ordered are listed, but only abnormal results are displayed) Labs Reviewed  BASIC METABOLIC PANEL - Abnormal; Notable for the following components:      Result Value   Glucose, Bld 124 (*)    GFR calc non Af Amer 59 (*)    All other components within normal limits  CBC - Abnormal; Notable for the following components:   Hemoglobin 15.6 (*)    MCH 34.5 (*)    All other components within normal limits  URINALYSIS, ROUTINE W REFLEX MICROSCOPIC - Abnormal; Notable for the following components:   Specific Gravity, Urine 1.004 (*)    Hgb urine dipstick SMALL (*)    Squamous Epithelial / LPF 0-5 (*)    All other components within normal limits  I-STAT TROPONIN, ED  CBG MONITORING, ED    EKG EKG Interpretation  Date/Time:  Friday November 12 2017 11:09:28 EDT Ventricular Rate:  112 PR Interval:    QRS Duration: 88 QT Interval:  364 QTC Calculation: 497 R Axis:   73 Text Interpretation:  Sinus tachycardia Abnormal R-wave progression, early transition Borderline T abnormalities, anterior leads Borderline prolonged QT interval since last tracing no significant  change Confirmed by Mancel Bale [phone]) on 11/12/2017 1:29:19 PM   Radiology No results found.  Procedures Procedures (including critical care time)  Medications Ordered in ED Medications  sodium chloride 0.9 % bolus 1,000 mL (1,000 mLs Intravenous New Bag/Given 11/12/17 1135)  LORazepam (ATIVAN) injection 0.5 mg (0.5 mg Intravenous Given 11/12/17 1135)     Initial Impression / Assessment and Plan / ED Course  I have reviewed the triage vital signs and the nursing notes.  Pertinent labs & imaging results that were available during my care of the patient were reviewed by me and considered in my medical decision making (see chart for details).     Patient in emergency department with anxiety, states had a panic attack.  According to EMS, patient was hyperventilating, and had a syncopal episode.  Will check EKG, basic labs, troponin.  Will monitor.  Ativan ordered.  Patient is mildly tachycardic, states "I am always tachycardic."  I will give her some IV fluids.  1:24 PM Labs unremarkable.  Heart rate is still mildly tachycardic, looked through patient's records, her heart rate is always around 100-110.  She has no shortness of breath or chest pain.  She feels much better with Ativan.  She would like a tablet before we discharge her. Discussed with dr. Effie Shy. Will give her prescription for 10 tab of klonopin, I am afraid that if pt is discharged with no medications she can withdraw from benzodiazapine and have a seizure.  I have discussed with her that we will no longer be able to do this and this is a one-time event.  Also discussed with her importance of following up with her doctor to adjust her medications if she is running out of them early.  Patient agreed.  Vitals:   11/12/17 1048 11/12/17 1130 11/12/17 1200 11/12/17 1230  BP:  (!) 120/91 (!) 131/97 (!) 131/99  Pulse:  (!) 108 (!) 104 (!) 107  Resp:  12 (!) 23 17  Temp:      TempSrc:      SpO2:  95% 96% 97%  Weight: 68.9 kg (152  lb)     Height: 5\' 6"  (1.676 m)  Final Clinical Impressions(s) / ED Diagnoses   Final diagnoses:  Panic attack  Anxiety disorder, unspecified type    ED Discharge Orders    None       Jaynie Crumble, PA-C 11/12/17 1536    Mancel Bale, MD 11/14/17 1743

## 2017-11-12 NOTE — ED Provider Notes (Signed)
  Face-to-face evaluation   History: She presents for evaluation of panic attack, associated with being out of her Klonopin for 3 days.  She admits to using too many Klonopin because she has been more stressed lately.  She understands that this is inappropriate and she will attempt to avoid that in the future.  Physical exam: Alert, calm, cooperative.  No respiratory distress.  No dysarthria or aphasia.  She is lucid.  Medical screening examination/treatment/procedure(s) were conducted as a shared visit with non-physician practitioner(s) and myself.  I personally evaluated the patient during the encounter    Mancel BaleWentz, Bryant Lipps, MD 11/14/17 1743

## 2017-11-12 NOTE — Discharge Instructions (Addendum)
Continue clonopin. We are prescribing you 10 tablets that should last you for the next 5 days if you take two tab a day. Do not take more than prescribed. This is not something that we would be able to do again in the future. Please follow up with your doctor to discuss your medications since you ran out early

## 2017-11-12 NOTE — ED Triage Notes (Signed)
BIB EMS, initially notified for panic attack and had a witnessed syncopal episode w/ EMS.   EMS Vitals BP 140/80 P 100 SPO2 95 RR 20 CBG 136

## 2017-11-18 ENCOUNTER — Other Ambulatory Visit: Payer: Self-pay

## 2017-11-18 ENCOUNTER — Emergency Department (HOSPITAL_COMMUNITY)
Admission: EM | Admit: 2017-11-18 | Discharge: 2017-12-08 | Disposition: E | Payer: Medicare Other | Attending: Emergency Medicine | Admitting: Emergency Medicine

## 2017-11-18 ENCOUNTER — Encounter (HOSPITAL_COMMUNITY): Payer: Self-pay

## 2017-11-18 ENCOUNTER — Emergency Department (HOSPITAL_COMMUNITY): Payer: Medicare Other

## 2017-11-18 DIAGNOSIS — I469 Cardiac arrest, cause unspecified: Secondary | ICD-10-CM

## 2017-11-18 DIAGNOSIS — Z79899 Other long term (current) drug therapy: Secondary | ICD-10-CM | POA: Insufficient documentation

## 2017-11-18 DIAGNOSIS — R1084 Generalized abdominal pain: Secondary | ICD-10-CM | POA: Diagnosis not present

## 2017-11-18 DIAGNOSIS — F17210 Nicotine dependence, cigarettes, uncomplicated: Secondary | ICD-10-CM | POA: Insufficient documentation

## 2017-11-18 DIAGNOSIS — R0602 Shortness of breath: Secondary | ICD-10-CM | POA: Diagnosis present

## 2017-11-18 LAB — COMPREHENSIVE METABOLIC PANEL
ALT: 125 U/L — ABNORMAL HIGH (ref 14–54)
ANION GAP: 19 — AB (ref 5–15)
AST: 194 U/L — ABNORMAL HIGH (ref 15–41)
Albumin: 3.6 g/dL (ref 3.5–5.0)
Alkaline Phosphatase: 154 U/L — ABNORMAL HIGH (ref 38–126)
BILIRUBIN TOTAL: 1 mg/dL (ref 0.3–1.2)
BUN: 19 mg/dL (ref 6–20)
CHLORIDE: 104 mmol/L (ref 101–111)
CO2: 19 mmol/L — ABNORMAL LOW (ref 22–32)
Calcium: 9.2 mg/dL (ref 8.9–10.3)
Creatinine, Ser: 1.77 mg/dL — ABNORMAL HIGH (ref 0.44–1.00)
GFR, EST AFRICAN AMERICAN: 33 mL/min — AB (ref >60)
GFR, EST NON AFRICAN AMERICAN: 29 mL/min — AB (ref >60)
Glucose, Bld: 202 mg/dL — ABNORMAL HIGH (ref 65–99)
POTASSIUM: 3.2 mmol/L — AB (ref 3.5–5.1)
Sodium: 142 mmol/L (ref 135–145)
TOTAL PROTEIN: 7 g/dL (ref 6.5–8.1)

## 2017-11-18 LAB — RAPID URINE DRUG SCREEN, HOSP PERFORMED
Amphetamines: NOT DETECTED
BARBITURATES: NOT DETECTED
BENZODIAZEPINES: POSITIVE — AB
COCAINE: NOT DETECTED
OPIATES: NOT DETECTED
TETRAHYDROCANNABINOL: NOT DETECTED

## 2017-11-18 LAB — CBC
HEMATOCRIT: 45.2 % (ref 36.0–46.0)
HEMOGLOBIN: 14.9 g/dL (ref 12.0–15.0)
MCH: 33.3 pg (ref 26.0–34.0)
MCHC: 33 g/dL (ref 30.0–36.0)
MCV: 101.1 fL — ABNORMAL HIGH (ref 78.0–100.0)
Platelets: 213 10*3/uL (ref 150–400)
RBC: 4.47 MIL/uL (ref 3.87–5.11)
RDW: 14.8 % (ref 11.5–15.5)
WBC: 12.2 10*3/uL — AB (ref 4.0–10.5)

## 2017-11-18 LAB — BRAIN NATRIURETIC PEPTIDE: B Natriuretic Peptide: 1004.1 pg/mL — ABNORMAL HIGH (ref 0.0–100.0)

## 2017-11-18 LAB — ETHANOL: Alcohol, Ethyl (B): <10 mg/dL (ref <10)

## 2017-11-18 LAB — D-DIMER, QUANTITATIVE: D-Dimer, Quant: >20 ug/mL-FEU — ABNORMAL HIGH (ref 0.00–0.50)

## 2017-11-18 LAB — I-STAT TROPONIN, ED: Troponin i, poc: 0.04 ng/mL (ref 0.00–0.08)

## 2017-11-18 MED ORDER — EPINEPHRINE PF 1 MG/10ML IJ SOSY
PREFILLED_SYRINGE | INTRAMUSCULAR | Status: AC | PRN
  Administered 2017-11-18: 1 mg via INTRAVENOUS

## 2017-11-18 MED ORDER — SODIUM CHLORIDE 0.9 % IV BOLUS
1000.0000 mL | Freq: Once | INTRAVENOUS | Status: AC
Start: 2017-11-18 — End: 2017-11-18
  Administered 2017-11-18: 1000 mL via INTRAVENOUS

## 2017-11-18 MED ORDER — SODIUM CHLORIDE 0.9 % IV SOLN
INTRAVENOUS | Status: AC | PRN
  Administered 2017-11-18: 2000 mL via INTRAVENOUS

## 2017-11-18 MED ORDER — ATROPINE SULFATE 1 MG/ML IJ SOLN
INTRAMUSCULAR | Status: AC | PRN
  Administered 2017-11-18: 1 mg via INTRAVENOUS

## 2017-11-18 MED ORDER — IPRATROPIUM-ALBUTEROL 0.5-2.5 (3) MG/3ML IN SOLN
3.0000 mL | Freq: Once | RESPIRATORY_TRACT | Status: AC
  Administered 2017-11-18: 3 mL via RESPIRATORY_TRACT
  Filled 2017-11-18: qty 3

## 2017-11-18 MED ORDER — SODIUM CHLORIDE 0.9 % IV SOLN: INTRAVENOUS | Status: DC

## 2017-11-18 MED ORDER — DEXAMETHASONE SODIUM PHOSPHATE 10 MG/ML IJ SOLN: 10.0000 mg | Freq: Once | INTRAMUSCULAR | Status: DC

## 2017-11-18 MED ORDER — METHYLPREDNISOLONE SODIUM SUCC 125 MG IJ SOLR
125.0000 mg | Freq: Once | INTRAMUSCULAR | Status: AC
  Administered 2017-11-18: 125 mg via INTRAVENOUS
  Filled 2017-11-18: qty 2

## 2017-11-18 MED ORDER — TENECTEPLASE 50 MG IV KIT
35.0000 mg | PACK | INTRAVENOUS | Status: AC
  Administered 2017-11-18: 35 mg via INTRAVENOUS
  Filled 2017-11-18: qty 10

## 2017-11-18 MED FILL — Medication: Qty: 2 | Status: AC

## 2017-12-04 IMAGING — CR DG CHEST 2V
2 series · 2 of 2 positions shown · non-contrast
Comparison: None.

CLINICAL DATA: Preoperative right total knee replacement. History
of asthma.

EXAM:
CHEST  2 VIEW

[w chest pa]
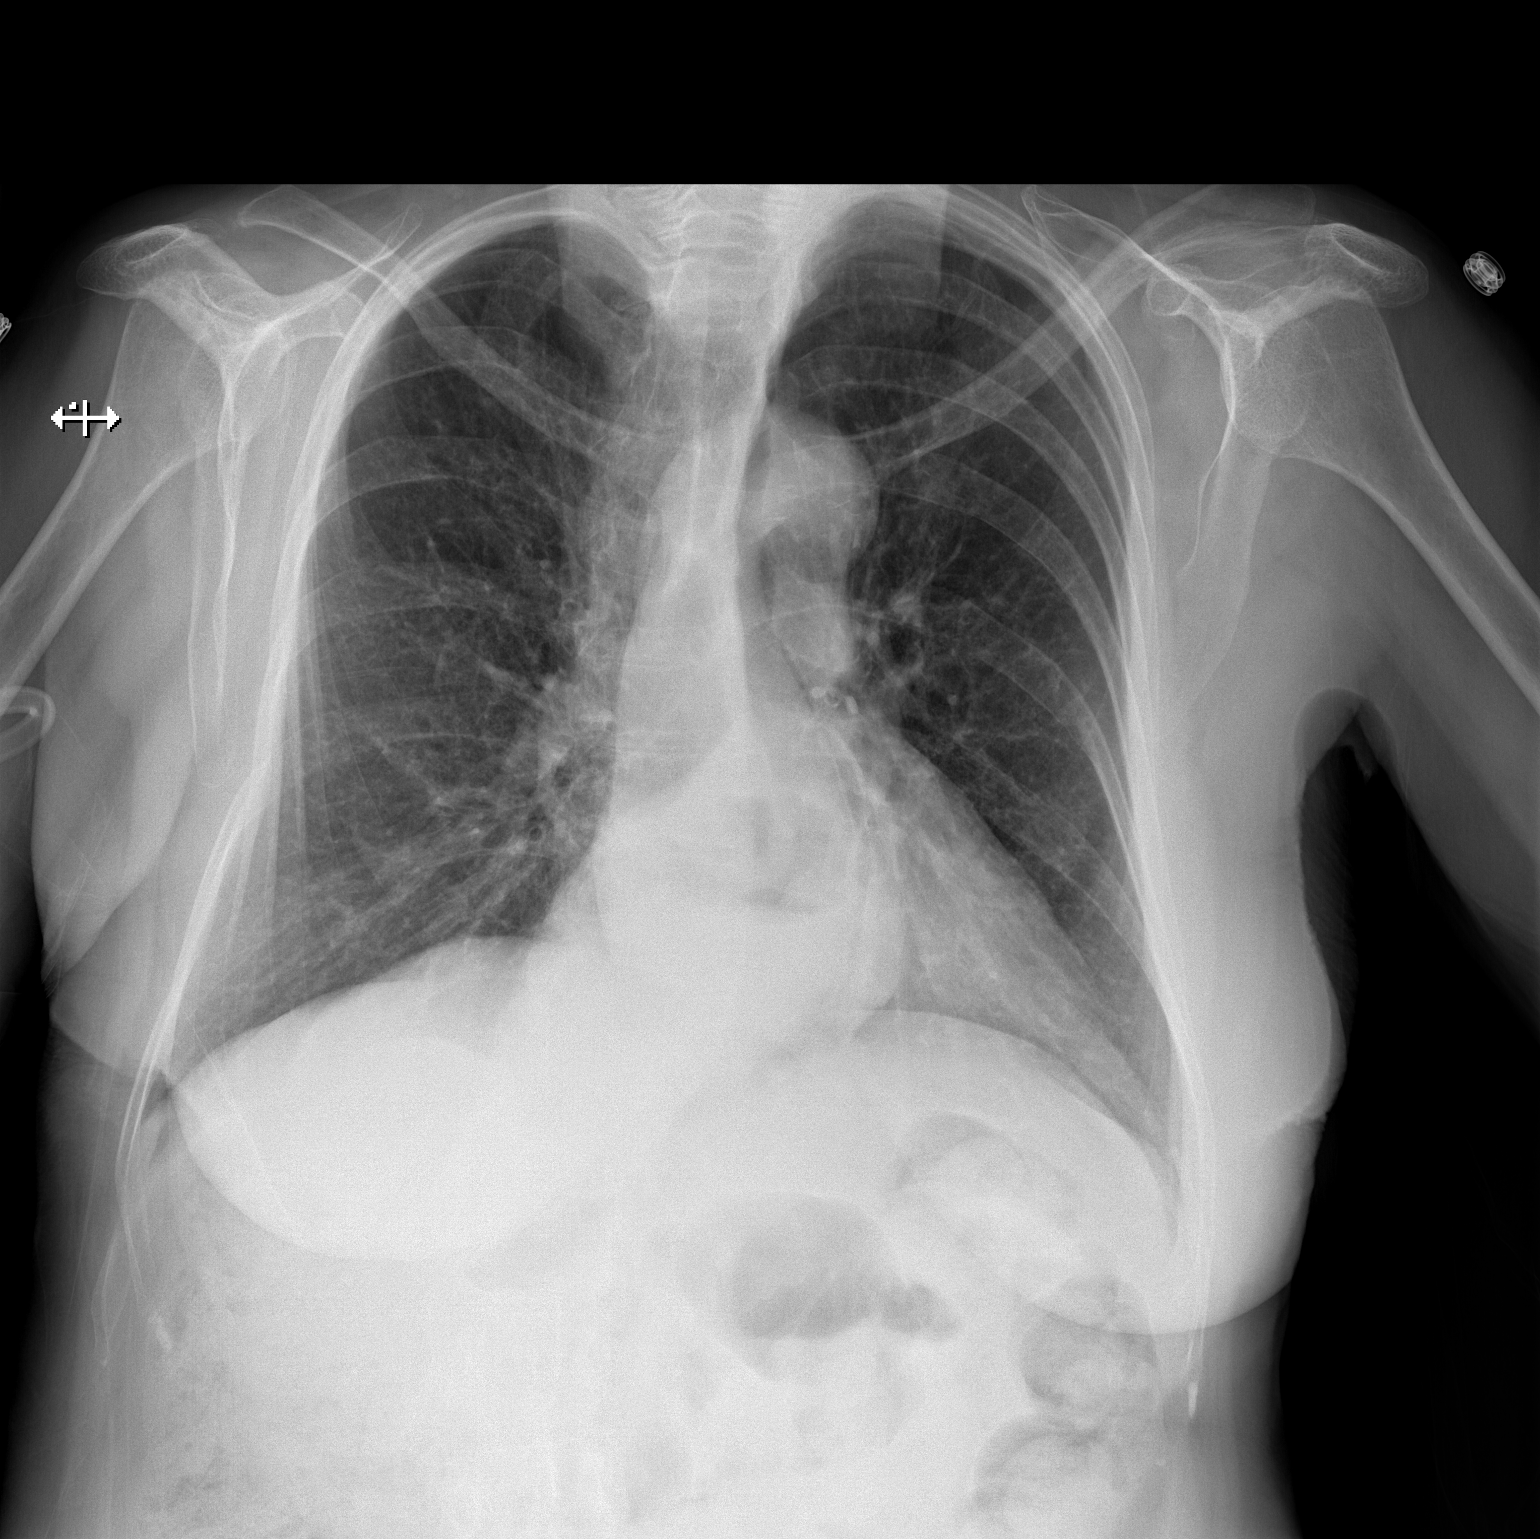

[w chest lat]
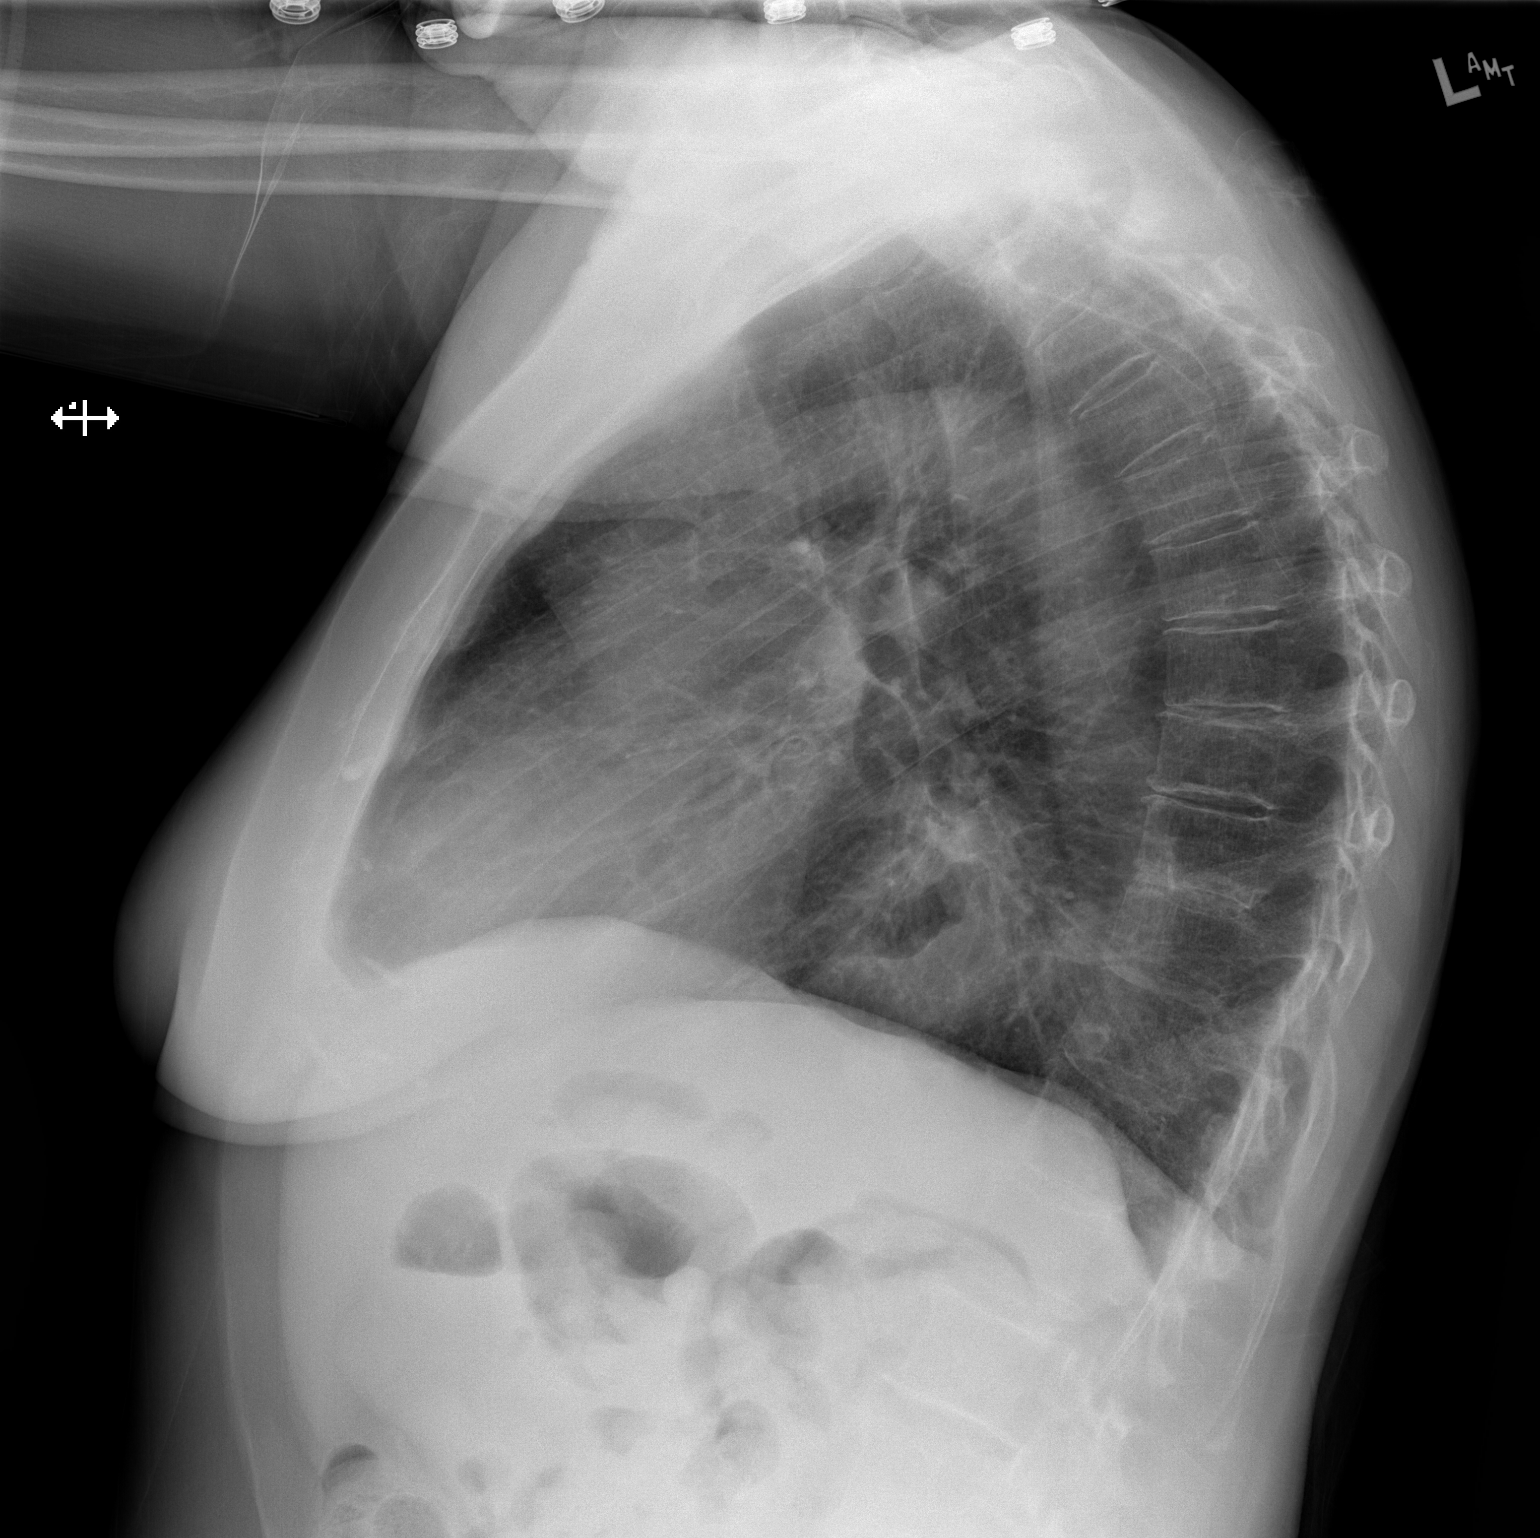

[2 of 2 positions shown; findings below may reference images not displayed]

FINDINGS: There is no edema or consolidation. Heart size and pulmonary
vascularity are normal. No adenopathy. There is a moderate hiatal
hernia. No bone lesions.
IMPRESSION: Moderate hiatal hernia.  No edema or consolidation.

## 2017-12-08 NOTE — Code Documentation (Signed)
Patient time of death occurred at 06-02-2018 at 1542

## 2017-12-08 NOTE — ED Triage Notes (Signed)
Patient BIB EMS from home with complaints of panic attack. Patient with history of panic attacks- [facility] Patient reports taking her prescribed anti-anxiety meds, but panic attack remains. Patient reports stressful relationship at home causing anxiety. Patient tachycardic in triage. CBG= 142.  Patient AxOx4 in triage.

## 2017-12-08 NOTE — ED Notes (Signed)
Bed: WA01 Expected date:  Expected time:  Means of arrival:  Comments: EMS panic attack

## 2017-12-08 NOTE — ED Notes (Signed)
Chaplain responded to page at 15:45 re: pt code.   When chaplain arrived, pt was deceased.  Pt's sister alerted and is in route.  [place].  Present on unit for family support when family arrives.   Notified night chaplain.  Will hand off coverage at 5pm

## 2017-12-08 NOTE — ED Notes (Signed)
Unit Secretary Misty StanleyLisa alerted this RN via radio that the patient's heart rhythm was reading in the 30s. This Clinical research associatewriter and EcologistN Royce immediately went to patient's room and found patient unresponsive and cyanotic in PEA rhythm. CPR was immediately initiated on patient and Code Blue was called. EDMD Jeraldine LootsLockwood and EDMD Fayrene FearingJames immediately responded to the call and took over directing the code. See code documentation.

## 2017-12-08 NOTE — ED Provider Notes (Signed)
[facility]-EMERGENCY DEPT Provider Note   CSN: [id number] Arrival date & time: 12-14-17  1152     History   Chief Complaint Chief Complaint  Patient presents with  . Panic Attack    HPI YAMILEX BORGWARDT is a 67 y.o. female.  HPI   Ms. Louvier is a 67yo female with a history of panic attacks, bipolar 1 disorder, benzodiazepine withdrawal delirium, asthma, tobacco use (38pk-yr) who presents to the emergency department for evaluation of shortness of breath.  Patient reports that for the last 2 or 3 days now she has felt short of breath upon exertion.  States that she only has to walk about 10 feet before feeling winded.  She has also had a nonproductive cough.  States that she has an albuterol inhaler at home, but has not used it given she has not had wheezing. Denies associated fevers, chills, chest pain, wheezing, sore throat, congestion, headache. Denies history of DVT/PE, leg swelling/pain, recent surgery or immobilization, exogenous estrogen. She also reports that she has generalized abdominal "soreness" which "isn't that bad." Reports she feels that the pain is worse when she is walking. She took one tylenol yesterday which did not improve her pain. She denies nausea/vomiting, diarrhea, dysuria, urinary frequency, vaginal discharge. She had a vaginal hysterectomy, no abdominal surgeries reported. Denies feeling anxious/nervous. States she took her klonopin today as prescribed.   Past Medical History:  Diagnosis Date  . Anemia   . Anxiety   . Asthma   . 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 Bipolar disorder (HCC)   . Carotid stenosis, bilateral     " mild 39%"  . Depression   . Family history of adverse reaction to anesthesia    sister had PONV  . Full dentures   . GERD (gastroesophageal reflux disease)   . H/O hiatal hernia   . Headache   . Hypothyroidism   . Inflammatory polyps of colon (HCC)    pt notes flat and is to have surgery  to remove   . OA (ocular albinism) (HCC)   . Osteoarthritis of both knees   . Pneumonia   . Seizure (HCC) 03/06/2014   tonic clonic seizure which lasted about a minute    Patient Active Problem List   Diagnosis Date Noted  . Postoperative anemia due to acute blood loss 11/12/2016  . Urinary retention 11/11/2016  . Primary osteoarthritis of right knee 11/09/2016  . 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 Seizure (HCC) 03/06/2014  . Urinary tract infection, site not specified 03/06/2014  . Osteoarthritis of both knees 01/13/2012  . Bipolar 1 disorder (HCC) 01/13/2012  . Hypothyroid 01/13/2012  . Tobacco use 01/13/2012    Past Surgical History:  Procedure Laterality Date  . CATARACT EXTRACTION W/ INTRAOCULAR LENS  IMPLANT, BILATERAL Bilateral   . MULTIPLE TOOTH EXTRACTIONS    . ROTATOR CUFF REPAIR Right   . TOTAL KNEE ARTHROPLASTY Right 11/09/2016   Procedure: RIGHT TOTAL KNEE ARTHROPLASTY;  Surgeon: Jodi Geralds, MD;  Location: [facility] OR;  Service: Orthopedics;  Laterality: Right;  . TUBAL LIGATION    . VAGINAL HYSTERECTOMY  01/2003   lap assisted w/BSO/notes 12/23/2010     OB History   None      Home Medications    Prior to Admission medications   Medication Sig Start Date End Date Taking? Authorizing Provider  atorvastatin (LIPITOR) 10 MG tablet Take 10 mg by mouth at bedtime. 01/29/16  Yes [provider]  clonazePAM (KLONOPIN) 0.5 MG tablet Take 1 tablet (0.5 mg total) by mouth 2 (two) times daily. Patient taking differently: Take 0.5 mg by mouth 3 (three) times daily.  06/19/15  Yes Arfeen, Phillips Grout, MD  hydrocortisone 2.5 % lotion Apply topically 2 (two) times daily as needed. For itching 11/26/14  Yes Charm Rings, MD  levothyroxine (SYNTHROID, LEVOTHROID) 88 MCG tablet take TABLET BY MOUTH DAILY ON EMPTY STOMACH 06/13/15  Yes [provider]  omeprazole (PRILOSEC) 40 MG capsule Take 40 mg by mouth daily. 04/12/16  Yes [provider]  oxyCODONE-acetaminophen (PERCOCET/ROXICET) 5-325 MG tablet Take 1-2 tablets by mouth every 6 (six) hours as needed for severe pain. 11/09/16  Yes Marshia Ly, PA-C  PARoxetine (PAXIL) 40 MG tablet Take 1 tablet (40 mg total) by mouth daily. 09/02/15  Yes Arfeen, Phillips Grout, MD  QUEtiapine (SEROQUEL) 400 MG tablet Take 800 mg by mouth at bedtime. 03/25/16  Yes [provider]  albuterol (PROVENTIL,VENTOLIN) 90 MCG/ACT inhaler Inhale 2 puffs into the lungs every 4 (four) hours as needed. 08/12/15 10/30/17  Rhetta Mura, MD  aspirin EC 325 MG tablet Take 1 tablet (325 mg total) by mouth 2 (two) times daily after a meal. Take x 1 month post op to decrease risk of blood clots. Patient not taking: Reported on Nov 21, 2017 11/09/16   Marshia Ly, PA-C  clonazePAM (KLONOPIN) 0.5 MG tablet Take 1 tablet (0.5 mg total) by mouth 2 (two) times daily as needed for anxiety. Patient not taking: Reported on Nov 21, 2017 11/12/17   Jaynie Crumble, PA-C  docusate sodium (COLACE) 100 MG capsule Take 1 capsule (100 mg total) by mouth 2 (two) times daily. Patient not taking: Reported on 11-21-17 11/09/16   Marshia Ly, PA-C  tiZANidine (ZANAFLEX) 2 MG tablet Take 1 tablet (2 mg total) by mouth every 8 (eight) hours as needed for muscle spasms. Patient not taking: Reported on 21-Nov-2017 11/09/16   Marshia Ly, PA-C    Family History Family History  Problem Relation Age of Onset  . Cancer Mother   . Cancer Brother   . Cancer Brother     Social History Social History   Tobacco Use  . Smoking status: Current Every Day Smoker    Packs/day: 0.50    Years: 44.00    Pack years: 22.00    Types: Cigarettes  . Smokeless tobacco: Never Used  Substance Use Topics  . Alcohol use: No    Alcohol/week: 0.0 oz  . Drug use: No     Allergies   Cortizone-10 [hydrocortisone] and Lamictal [lamotrigine]   Review of Systems Review of Systems  Constitutional: Negative for chills, diaphoresis,  fever and unexpected weight change.  HENT: Negative for congestion, rhinorrhea, sore throat and trouble swallowing.   Respiratory: Positive for cough and shortness of breath. Negative for chest tightness and wheezing.   Cardiovascular: Negative for chest pain and leg swelling.  Gastrointestinal: Positive for abdominal pain (generalized abdominal soreness). Negative for diarrhea, nausea and vomiting.  Genitourinary: Negative for difficulty urinating, dysuria, frequency, hematuria and vaginal discharge.  Musculoskeletal: Negative for gait problem.  Skin: Negative for rash.  Neurological: Negative for weakness, light-headedness, numbness and headaches.  Psychiatric/Behavioral: The patient is not nervous/anxious.      Physical Exam Updated Vital Signs BP (!) 105/92   Pulse (!) 110   Ht 5\' 6"  (1.676 m)   Wt 68.9 kg (152 lb)   SpO2 95%   BMI 24.53 kg/m   Physical Exam  Constitutional:  She is oriented to person, place, and time. She appears well-developed and well-nourished. No distress.  HENT:  Head: Normocephalic and atraumatic.  Mouth/Throat: Oropharynx is clear and moist. No oropharyngeal exudate.  Eyes: Pupils are equal, round, and reactive to light. Conjunctivae are normal. Right eye exhibits no discharge. Left eye exhibits no discharge.  Neck: Normal range of motion. Neck supple. No JVD present. No tracheal deviation present.  Cardiovascular:  Tachycardic, regular rate. Distant heart sounds, no murmur auscultated.   Pulmonary/Chest: Effort normal. No respiratory distress.  No respiratory distress, patient speaking in full sentences. Tachypneic. 02 sat 95% on RA. Lungs CTA. No stridor, wheezes or rales.   Abdominal: Soft. Bowel sounds are normal. There is no tenderness. There is no guarding.  Negative Murphy's sign.   Musculoskeletal:  No leg swelling or calf tenderness  Neurological: She is alert and oriented to person, place, and time. Coordination normal.  Skin: Skin is warm  and dry. Capillary refill takes less than 2 seconds. She is not diaphoretic.  Psychiatric: She has a normal mood and affect. Her behavior is normal.  Nursing note and vitals reviewed.    ED Treatments / Results  Labs (all labs ordered are listed, but only abnormal results are displayed) Labs Reviewed  COMPREHENSIVE METABOLIC PANEL - Abnormal; Notable for the following components:      Result Value   Potassium 3.2 (*)    CO2 19 (*)    Glucose, Bld 202 (*)    Creatinine, Ser 1.77 (*)    AST 194 (*)    ALT 125 (*)    Alkaline Phosphatase 154 (*)    GFR calc non Af Amer 29 (*)    GFR calc Af Amer 33 (*)    Anion gap 19 (*)    All other components within normal limits  CBC - Abnormal; Notable for the following components:   WBC 12.2 (*)    MCV 101.1 (*)    All other components within normal limits  RAPID URINE DRUG SCREEN, HOSP PERFORMED - Abnormal; Notable for the following components:   Benzodiazepines POSITIVE (*)    All other components within normal limits  BRAIN NATRIURETIC PEPTIDE - Abnormal; Notable for the following components:   B Natriuretic Peptide 1,004.1 (*)    All other components within normal limits  D-DIMER, QUANTITATIVE (NOT AT [facility]) - Abnormal; Notable for the following components:   D-Dimer, Quant >20.00 (*)    All other components within normal limits  ETHANOL  APTT  PROTIME-INR  CBC  I-STAT TROPONIN, ED    EKG EKG Interpretation  Date/Time:  Thursday 11/21/17 14:27:03 EDT Ventricular Rate:  113 PR Interval:    QRS Duration: 108 QT Interval:  337 QTC Calculation: 462 R Axis:   85 Text Interpretation:  Sinus tachycardia Probable lateral infarct, age indeterminate Confirmed by Bethann Berkshire ([id number]) on 21-Nov-2017 3:14:31 PM   Radiology Dg Chest 2 View  Result Date: 11/21/2017 CLINICAL DATA:  67 y/o  F; shortness of breath. EXAM: CHEST - 2 VIEW COMPARISON:  11/09/2016 chest radiograph FINDINGS: Stable normal cardiac silhouette given  projection and technique. Mild enlargement of moderate to large hiatal hernia. No consolidation, effusion, or pneumothorax. Bones are unremarkable. IMPRESSION: No acute pulmonary process identified. Mild enlargement of moderate to large hiatal hernia. Electronically Signed   By: Mitzi Hansen M.D.   On: 2017-11-21 15:00    Procedures Procedures (including critical care time)  Medications Ordered in ED Medications  0.9 %  sodium  chloride infusion (has no administration in time range)  tenecteplase (TNKASE) injection 35 mg (has no administration in time range)  sodium chloride 0.9 % bolus 1,000 mL (0 mLs Intravenous Stopped 2017/12/05 1437)  ipratropium-albuterol (DUONEB) 0.5-2.5 (3) MG/3ML nebulizer solution 3 mL (3 mLs Nebulization Given December 05, 2017 1410)  methylPREDNISolone sodium succinate (SOLU-MEDROL) 125 mg/2 mL injection 125 mg (125 mg Intravenous Given 2017-12-05 1409)  EPINEPHrine (ADRENALIN) 1 MG/10ML injection (1 mg Intravenous Given 12-05-17 1514)  EPINEPHrine (ADRENALIN) 1 MG/10ML injection (1 mg Intravenous Given 12/05/17 1517)  EPINEPHrine (ADRENALIN) 1 MG/10ML injection (1 mg Intravenous Given Dec 05, 2017 1520)  atropine injection (1 mg Intravenous Given December 05, 2017 1520)  EPINEPHrine (ADRENALIN) 1 MG/10ML injection (1 mg Intravenous Given 12/05/17 1523)  0.9 %  sodium chloride infusion (2,000 mLs Intravenous New Bag/Given Dec 05, 2017 1524)  EPINEPHrine (ADRENALIN) 1 MG/10ML injection (1 mg Intravenous Given 12-05-17 1526)  EPINEPHrine (ADRENALIN) 1 MG/10ML injection (1 mg Intravenous Given 05-Dec-2017 1531)     Initial Impression / Assessment and Plan / ED Course  I have reviewed the triage vital signs and the nursing notes.  Pertinent labs & imaging results that were available during my care of the patient were reviewed by me and considered in my medical decision making (see chart for details).     Patient presents with dyspnea on exertion for the past several days. Reports associated  non-productive cough, denies wheezing. Has a history of tobacco use and anxiety. No prior history of heart failure or ischemic heart disease.   On initial exam patient alert and non-toxic appearing. No respiratory distress. She is tachycardic (110bpm) and tachypneic. Pulse ox 95% on RA. Lungs CTA. No leg swelling or signs of fluid overload. Initial labs ordered by triage show that she has elevation in Creatinine of 1.77. No signs of fluid overload on exam, NS bolus ordered given increase in creatinine from baseline.  She also has elevation of liver enzymes (AST 194 and ALT 125), no history of this prior and patient denies heavy tylenol or alcohol use. Her WBC count is mildly elevated 12.2.   Concern for PE given lungs CTA and patient is tachycardic and tachypneic. Discussed this patient with Dr. Estell Harpin who also saw the patient and recommends nebulizer breathing treatment and solumedrol for potential COPD exacerbation given patient's smoking history. He suggests Ddimer and CXR prior to CT of the chest to further evaluate. Troponin, EKG ordered as ACS may be contributing. BNP also ordered.   Went to see patient to tell her that we are going to give her a breathing treatment and get more labs and a chest xray. She is sitting at the bedside, in NAD. She agrees with plan.    Labs return. Ddimer is markedly elevated >20.00. BNP is also elevated 1,004.1. Troponin negative. EKG reveals sinus tachycardia. Heightened concern for PE given these results and patient's presentation. Discussed this patient again with Dr. Estell Harpin and CT PE study of the chest ordered.   Minutes later I'm called into the patient's room after she became progressively bradycardic, apneic and pulseless. Dr. Jeraldine Loots is in the room and patient is actively receiving CPR by nursing staff with Dr. Jeraldine Loots running the code per ACLS protocol with patient receiving several rounds of epinephrine and one dose of atropine. Please see his note for  further details. Patient ultimately received tenecteplase for concern of pulmonary embolism contributing to her cardiac arrest. Patient did not have return of spontaneous circulation and resuscitation stopped after about 45 minutes.  Final Clinical Impressions(s) /  ED Diagnoses   Final diagnoses:  None    ED Discharge Orders    None       Lawrence MarseillesShrosbree, Khadim Lundberg J, PA-C 11/19/17 1142    Bethann BerkshireZammit, Joseph, MD 11/20/17 [phone]

## 2017-12-08 NOTE — Code Documentation (Signed)
Organ procurement team notified. Reference number: [id number]. This Clinical research associatewriter spoke with [employer] [place]. Patient is a candidate for eye and tissue donation. Patient's eyes prepared post-mortem. Patient family with chaplain. Will discuss funeral arrangements.

## 2017-12-08 NOTE — ED Notes (Signed)
EDPA verbal ordered holding patient IV fluids at this time, due to increased BNP lab values. Patient has receieved IVNS at this time. IVF stopped per order.

## 2017-12-08 NOTE — Progress Notes (Signed)
  Name: [name]  [facility]  Ashby DawesNature of call: End of Life   Chaplain arrived and Insurance claims handlertaff chaplain Stalaker passed off. Pt was deceased and family at bedside. Chaplain prayed with family and assisted staff in providing support to family. If there are any other, concerns please feel free to page the on-call chaplain  [phone]  Thanks

## 2017-12-08 NOTE — Code Documentation (Signed)
PE dose of TPA delivered by pharmacy to Room 1. Administered by Dr. Jeraldine LootsLockwood to the patient via IV push to left AC PIV.

## 2017-12-08 NOTE — ED Notes (Signed)
Pts brown purse given to sister Sheela Stacknnette Haley and daughter Beverely PaceMichelle Smith.  $11.00 cash and a pack of cigarettes in purse.  6 empty pill bottles thrown in trash with Dr Jeraldine LootsLockwood, 1 bottle of clonopin, seroquel, and paxil taken to pharmacy and given to pharmacy tech with home med inventory sheet.

## 2017-12-08 NOTE — ED Notes (Signed)
Pastoral care comforting family

## 2017-12-08 NOTE — ED Notes (Signed)
Patient complains of increased shortness of breath. Ordered duo-neb administered. Ordered IV steroid administered. Ordered IV fluids administered. Patient VSS at this time. Patient reports the breathing treatment "didn't help" her shortness of breath. Patient has ordered Xray- updated patient on POC. Updated EDPA on patient status.

## 2017-12-08 NOTE — ED Notes (Signed)
Patient's purse and all her medication bottles removed from the room and placed in a patient belonging bag with label. Patient's belongings then placed in cabinets across from Room 4.

## 2017-12-08 NOTE — ED Provider Notes (Signed)
I was called in the patient's room after she had acute decline in condition. Patient had earlier presented with concern of dyspnea, dyspnea with exertion, and fatigue. On my initial evaluation the patient was actively receiving CPR, after reportedly having had progressive bradycardia, apnea, and pulselessness. CPR continued, per ACLS protocol, with the patient receiving multiple doses of epinephrine, 1 dose of atropine.  Cardiopulmonary Resuscitation (CPR) Procedure Note Directed/Performed by: Gerhard Munchobert Jezelle Gullick I personally directed ancillary staff and/or performed CPR in an effort to regain return of spontaneous circulation and to maintain cardiac, neuro and systemic perfusion.    After 2 rounds of CPR, with no return of spontaneous relation, the patient had intubation, performed successfully on first pass, without complication, via video laryngoscopy, performed with our EMT colleagues.  INTUBATION Performed by: Gerhard Munchobert Gauri Galvao  Required items: required blood products, implants, devices, and special equipment available Patient identity confirmed: provided demographic data and hospital-assigned identification number Time out: Immediately prior to procedure a "time out" was called to verify the correct patient, procedure, equipment, support staff and site/side marked as required.  Indications: cardiac arrest  Intubation method: Glidescope Laryngoscopy   Preoxygenation: BVM  Sedatives: not necessary  Tube Size: 7.5 cuffed  Post-procedure assessment: chest rise and ETCO2 monitor Breath sounds: equal and absent over the epigastrium Tube secured with: ETT holder   Patient tolerated the procedure well with no immediate complications.  However, immediately after placement of the endotracheal tube, there is production of frothy sputum, which soon became bloody sputum.   During the resuscitation, with consideration of pulmonary aneurysm given the patient's description of dyspnea, worse with  exertion, and history of smoking, relative sedentary status, I discussed her case with our pharmacy team, and she received tenecteplase. In spite of continued resuscitation efforts the patient had no return of spontaneous circulation, and resuscitative efforts were stopped after approximately 45 minutes of CPR/resuscitation.  Time of Death [id number]  Subsequently discussed patient's case with our medical examiner, and with her primary care team. Family members were also notified, and I spoke with several of them several times.   CRITICAL CARE Performed by: Gerhard Munchobert Jaxon Mynhier Total critical care time: 35 minutes Critical care time was exclusive of separately billable procedures and treating other patients. Critical care was necessary to treat or prevent imminent or life-threatening deterioration. Critical care was time spent personally by me on the following activities: development of treatment plan with patient and/or surrogate as well as nursing, discussions with consultants, evaluation of patient's response to treatment, examination of patient, obtaining history from patient or surrogate, ordering and performing treatments and interventions, ordering and review of laboratory studies, ordering and review of radiographic studies, pulse oximetry and re-evaluation of patient's condition.    Gerhard MunchLockwood, Jennet Scroggin, MD 07/12/2018 [phone]

## 2017-12-08 NOTE — ED Notes (Addendum)
This Clinical research associatewriter and [employer] Student nurse accompanied radiology tech to radiology department. Rad tech reported patient was having difficulty sitting still for her xray. This Clinical research associatewriter and SN went with patient to xray and encouraged patient cooperation with rad tech. Chest xray completed with patient in stretcher. Patient tolerated well. Patient wheeled back to room and placed back on cardiac monitor. Patient denies chest pain, but endorses continued shortness of breath. Patient placed in high fowlers position for comfort. Patient's lung sounds were auscultated as clear throughout. Patient updated on plan of care and waiting for CT scan with PE study. Patient vital signs stable upon this writer and SN leaving the room.

## 2017-12-08 DEATH — deceased
